# Patient Record
Sex: Male | Born: 1955 | Race: Black or African American | Hispanic: No | Marital: Married
Health system: Southern US, Community
[De-identification: ages and names within clinical notes are randomized; demographics above are authoritative.]

## PROBLEM LIST (undated history)

## (undated) DIAGNOSIS — I2510 Atherosclerotic heart disease of native coronary artery without angina pectoris: Secondary | ICD-10-CM

## (undated) DIAGNOSIS — I10 Essential (primary) hypertension: Secondary | ICD-10-CM

## (undated) DIAGNOSIS — R7303 Prediabetes: Secondary | ICD-10-CM

## (undated) DIAGNOSIS — Z91199 Patient's noncompliance with other medical treatment and regimen due to unspecified reason: Secondary | ICD-10-CM

## (undated) DIAGNOSIS — E785 Hyperlipidemia, unspecified: Secondary | ICD-10-CM

## (undated) HISTORY — DX: Atherosclerotic heart disease of native coronary artery without angina pectoris: I25.10

## (undated) HISTORY — DX: Patient's noncompliance with other medical treatment and regimen due to unspecified reason: Z91.199

---

## 2018-01-03 ENCOUNTER — Encounter (HOSPITAL_COMMUNITY): Payer: Self-pay | Admitting: Emergency Medicine

## 2018-01-03 ENCOUNTER — Emergency Department (HOSPITAL_COMMUNITY): Payer: Non-veteran care

## 2018-01-03 ENCOUNTER — Inpatient Hospital Stay (HOSPITAL_COMMUNITY)
Admission: EM | Admit: 2018-01-03 | Discharge: 2018-01-12 | DRG: 234 | Disposition: A | Discharge status: Home / Self Care | Payer: Non-veteran care | Attending: Cardiothoracic Surgery | Admitting: Cardiothoracic Surgery

## 2018-01-03 ENCOUNTER — Observation Stay (HOSPITAL_COMMUNITY): Payer: Non-veteran care

## 2018-01-03 DIAGNOSIS — I10 Essential (primary) hypertension: Secondary | ICD-10-CM | POA: Diagnosis not present

## 2018-01-03 DIAGNOSIS — Z7982 Long term (current) use of aspirin: Secondary | ICD-10-CM

## 2018-01-03 DIAGNOSIS — R7303 Prediabetes: Secondary | ICD-10-CM | POA: Diagnosis not present

## 2018-01-03 DIAGNOSIS — Z79899 Other long term (current) drug therapy: Secondary | ICD-10-CM

## 2018-01-03 DIAGNOSIS — I214 Non-ST elevation (NSTEMI) myocardial infarction: Secondary | ICD-10-CM | POA: Diagnosis not present

## 2018-01-03 DIAGNOSIS — R791 Abnormal coagulation profile: Secondary | ICD-10-CM | POA: Diagnosis present

## 2018-01-03 DIAGNOSIS — Z951 Presence of aortocoronary bypass graft: Secondary | ICD-10-CM

## 2018-01-03 DIAGNOSIS — I249 Acute ischemic heart disease, unspecified: Secondary | ICD-10-CM | POA: Diagnosis present

## 2018-01-03 DIAGNOSIS — E7800 Pure hypercholesterolemia, unspecified: Secondary | ICD-10-CM

## 2018-01-03 DIAGNOSIS — I2510 Atherosclerotic heart disease of native coronary artery without angina pectoris: Secondary | ICD-10-CM | POA: Diagnosis present

## 2018-01-03 DIAGNOSIS — I200 Unstable angina: Secondary | ICD-10-CM | POA: Diagnosis not present

## 2018-01-03 DIAGNOSIS — I25110 Atherosclerotic heart disease of native coronary artery with unstable angina pectoris: Principal | ICD-10-CM | POA: Diagnosis present

## 2018-01-03 DIAGNOSIS — Z8249 Family history of ischemic heart disease and other diseases of the circulatory system: Secondary | ICD-10-CM

## 2018-01-03 DIAGNOSIS — E785 Hyperlipidemia, unspecified: Secondary | ICD-10-CM | POA: Diagnosis present

## 2018-01-03 HISTORY — DX: Prediabetes: R73.03

## 2018-01-03 HISTORY — DX: Hyperlipidemia, unspecified: E78.5

## 2018-01-03 HISTORY — DX: Essential (primary) hypertension: I10

## 2018-01-03 LAB — HEMOGLOBIN A1C
Hgb A1c MFr Bld: 6.1 % — ABNORMAL HIGH (ref 4.8–5.6)
MEAN PLASMA GLUCOSE: 128.37 mg/dL

## 2018-01-03 LAB — CBC
HEMATOCRIT: 41.5 % (ref 39.0–52.0)
HEMOGLOBIN: 14.6 g/dL (ref 13.0–17.0)
MCH: 33.1 pg (ref 26.0–34.0)
MCHC: 35.2 g/dL (ref 30.0–36.0)
MCV: 94.1 fL (ref 78.0–100.0)
Platelets: 258 10*3/uL (ref 150–400)
RBC: 4.41 MIL/uL (ref 4.22–5.81)
RDW: 13 % (ref 11.5–15.5)
WBC: 7.2 10*3/uL (ref 4.0–10.5)

## 2018-01-03 LAB — I-STAT TROPONIN, ED: TROPONIN I, POC: 0.06 ng/mL (ref 0.00–0.08)

## 2018-01-03 LAB — BASIC METABOLIC PANEL
ANION GAP: 8 (ref 5–15)
BUN: 6 mg/dL (ref 6–20)
CALCIUM: 8.7 mg/dL — AB (ref 8.9–10.3)
CHLORIDE: 104 mmol/L (ref 101–111)
CO2: 26 mmol/L (ref 22–32)
Creatinine, Ser: 0.98 mg/dL (ref 0.61–1.24)
GFR calc non Af Amer: >60 mL/min (ref >60)
Glucose, Bld: 114 mg/dL — ABNORMAL HIGH (ref 65–99)
POTASSIUM: 3.5 mmol/L (ref 3.5–5.1)
Sodium: 138 mmol/L (ref 135–145)

## 2018-01-03 LAB — HEPATIC FUNCTION PANEL
ALBUMIN: 3.7 g/dL (ref 3.5–5.0)
ALT: 17 U/L (ref 17–63)
AST: 19 U/L (ref 15–41)
Alkaline Phosphatase: 72 U/L (ref 38–126)
BILIRUBIN INDIRECT: 0.7 mg/dL (ref 0.3–0.9)
Bilirubin, Direct: 0.1 mg/dL (ref 0.1–0.5)
TOTAL PROTEIN: 6.8 g/dL (ref 6.5–8.1)
Total Bilirubin: 0.8 mg/dL (ref 0.3–1.2)

## 2018-01-03 LAB — RAPID URINE DRUG SCREEN, HOSP PERFORMED
Amphetamines: NOT DETECTED
BENZODIAZEPINES: NOT DETECTED
Barbiturates: NOT DETECTED
COCAINE: NOT DETECTED
Opiates: NOT DETECTED
Tetrahydrocannabinol: POSITIVE — AB

## 2018-01-03 LAB — MAGNESIUM: MAGNESIUM: 1.8 mg/dL (ref 1.7–2.4)

## 2018-01-03 LAB — BRAIN NATRIURETIC PEPTIDE: B NATRIURETIC PEPTIDE 5: 102.8 pg/mL — AB (ref 0.0–100.0)

## 2018-01-03 LAB — D-DIMER, QUANTITATIVE: D-Dimer, Quant: 3.5 ug/mL-FEU — ABNORMAL HIGH (ref 0.00–0.50)

## 2018-01-03 LAB — TSH: TSH: 1.405 u[IU]/mL (ref 0.350–4.500)

## 2018-01-03 LAB — TROPONIN I: Troponin I: 0.05 ng/mL (ref <0.03)

## 2018-01-03 MED ORDER — HEPARIN (PORCINE) IN NACL 100-0.45 UNIT/ML-% IJ SOLN
1350.0000 [IU]/h | INTRAMUSCULAR | Status: DC
  Administered 2018-01-03: 1000 [IU]/h via INTRAVENOUS
  Administered 2018-01-04 – 2018-01-05 (×2): 1350 [IU]/h via INTRAVENOUS
  Filled 2018-01-03 (×3): qty 250

## 2018-01-03 MED ORDER — SODIUM CHLORIDE 0.9% FLUSH
3.0000 mL | Freq: Two times a day (BID) | INTRAVENOUS | Status: DC
  Administered 2018-01-03 – 2018-01-04 (×3): 3 mL via INTRAVENOUS

## 2018-01-03 MED ORDER — POTASSIUM CHLORIDE CRYS ER 20 MEQ PO TBCR
40.0000 meq | EXTENDED_RELEASE_TABLET | Freq: Once | ORAL | Status: AC
  Administered 2018-01-03: 40 meq via ORAL
  Filled 2018-01-03: qty 2

## 2018-01-03 MED ORDER — ACETAMINOPHEN 325 MG PO TABS: 650.0000 mg | ORAL_TABLET | ORAL | Status: DC | PRN

## 2018-01-03 MED ORDER — SODIUM CHLORIDE 0.9 % IV SOLN: 250.0000 mL | INTRAVENOUS | Status: DC | PRN

## 2018-01-03 MED ORDER — IOPAMIDOL (ISOVUE-370) INJECTION 76%
INTRAVENOUS | Status: AC
  Filled 2018-01-03: qty 100

## 2018-01-03 MED ORDER — ONDANSETRON HCL 4 MG/2ML IJ SOLN: 4.0000 mg | Freq: Four times a day (QID) | INTRAMUSCULAR | Status: DC | PRN

## 2018-01-03 MED ORDER — LISINOPRIL 10 MG PO TABS
10.0000 mg | ORAL_TABLET | Freq: Every day | ORAL | Status: DC
  Administered 2018-01-03 – 2018-01-05 (×3): 10 mg via ORAL
  Filled 2018-01-03 (×3): qty 1

## 2018-01-03 MED ORDER — NITROGLYCERIN IN D5W 200-5 MCG/ML-% IV SOLN: 0.0000 ug/min | INTRAVENOUS | Status: DC

## 2018-01-03 MED ORDER — ATORVASTATIN CALCIUM 40 MG PO TABS: 40.0000 mg | ORAL_TABLET | Freq: Every day | ORAL | Status: DC

## 2018-01-03 MED ORDER — ASPIRIN EC 81 MG PO TBEC
81.0000 mg | DELAYED_RELEASE_TABLET | Freq: Every day | ORAL | Status: DC
  Administered 2018-01-04 – 2018-01-05 (×2): 81 mg via ORAL
  Filled 2018-01-03 (×2): qty 1

## 2018-01-03 MED ORDER — NITROGLYCERIN 0.4 MG SL SUBL: 0.4000 mg | SUBLINGUAL_TABLET | SUBLINGUAL | Status: DC | PRN

## 2018-01-03 MED ORDER — SODIUM CHLORIDE 0.9% FLUSH: 3.0000 mL | INTRAVENOUS | Status: DC | PRN

## 2018-01-03 MED ORDER — IOPAMIDOL (ISOVUE-370) INJECTION 76%
100.0000 mL | Freq: Once | INTRAVENOUS | Status: AC | PRN
  Administered 2018-01-03: 100 mL via INTRAVENOUS

## 2018-01-03 MED ORDER — HEPARIN BOLUS VIA INFUSION
4000.0000 [IU] | Freq: Once | INTRAVENOUS | Status: AC
  Administered 2018-01-03: 4000 [IU] via INTRAVENOUS
  Filled 2018-01-03: qty 4000

## 2018-01-03 NOTE — ED Notes (Signed)
MD paged for positive D dimer of 3.5

## 2018-01-03 NOTE — ED Notes (Signed)
Patient endorses central, non-radiating CP x 1 week, intermittent in nature, and has been brought on while at rest (sts it woke him up at 2am this morning) and with exertion (walking) - resting for a few minutes relieves pain. Patient states his father died of cardiac disease at age 60. Patient denies SOB, dizziness/lightheadedness, or N/V.

## 2018-01-03 NOTE — ED Notes (Signed)
EDP states okay for patient to eat/drink - patient provided with [place] sandwich.

## 2018-01-03 NOTE — ED Notes (Signed)
Pt given a ice pack for lt arm

## 2018-01-03 NOTE — ED Triage Notes (Signed)
Per EMS- pt has had intermittent central chest pain for 2 weeks with walking. Pt self administered 648 Asprin/ EMS gage 1 nitro. Per pt he feels better.

## 2018-01-03 NOTE — ED Notes (Signed)
Patient states he doesn't know if he wants to stay, RN explained recommendation from cardiology and patient states he can rest at home - MD made aware and to talk with patient.

## 2018-01-03 NOTE — H&P (Addendum)
Admit date: 01/03/2018 Referring Physician  Dr. Ethelda Chick Primary Physician  [facility] Primary Cardiologist  none Reason for Consultation  Chest pain  HPI: Cameron Camacho is a 62 y.o. male who is being seen today for the evaluation of chest pain at the request of Dr. Doug Sou in ER.  This is a pleasant 62 year old male with a past medical history of hypertension but no cardiac disease who presents the emergency room with complaints of 1 week of intermittent anterior chest pain with no radiation.  Most of them of her occurred with exertion but today had an episode at rest associated with this of breath and diaphoresis but no nausea.  He took 2 aspirin and called EMS and was given 1 sublingual nitroglycerin with complete relief of his symptoms.  He currently denies any chest pain.  Initial troponin 0 0.06.  EKG shows normal sinus rhythm with T wave inversions in 1 aVL V1 and V2 and prolonged QT at 510 ms.  Chest x-ray showed no acute disease.   PMH:   Past Medical History:  Diagnosis Date  . Hyperlipidemia   . Hypertension   . Prediabetes      PSH:  History reviewed. No pertinent surgical history.  Allergies:  Patient has no known allergies. Prior to Admit Meds:   Medications Prior to Admission  Medication Sig Dispense Refill Last Dose  . APPLE CIDER VINEGAR PO Take 15 mLs by mouth daily. Mix in 8 oz water and drink   01/03/2018 at am  . aspirin 325 MG tablet Take 650 mg by mouth once.   01/03/2018 at 1200  . Garlic 1000 MG CAPS Take 1,000 mg by mouth daily.   01/02/2018 at am  . Naphazoline HCl (CLEAR EYES OP) Place 1 drop into both eyes daily as needed (tired eyes).   01/02/2018 at Unknown time  . naproxen sodium (ALEVE) 220 MG tablet Take 220 mg by mouth 2 (two) times daily as needed (pain).   few days ago  . oxymetazoline (AFRIN) 0.05 % nasal spray Place 1 spray into both nostrils 2 (two) times daily.   01/03/2018 at am   Fam HX:    Family History  Problem Relation Age  of Onset  . Hypertension Mother   . CAD Father        Heart attack mid 75s  . Hypertension Brother    Social HX:    Social History   Socioeconomic History  . Marital status: Married    Spouse name: Not on file  . Number of children: Not on file  . Years of education: Not on file  . Highest education level: Not on file  Occupational History  . Not on file  Social Needs  . Financial resource strain: Not on file  . Food insecurity:    Worry: Not on file    Inability: Not on file  . Transportation needs:    Medical: Not on file    Non-medical: Not on file  Tobacco Use  . Smoking status: Passive Smoke Exposure - Never Smoker  Substance and Sexual Activity  . Alcohol use: Not Currently    Frequency: Never  . Drug use: Yes    Types: Marijuana    Comment: Occasional THC for pain  . Sexual activity: Not on file  Lifestyle  . Physical activity:    Days per week: Not on file    Minutes per session: Not on file  . Stress: Not on file  Relationships  .  Social connections:    Talks on phone: Not on file    Gets together: Not on file    Attends religious service: Not on file    Active member of club or organization: Not on file    Attends meetings of clubs or organizations: Not on file    Relationship status: Not on file  . Intimate partner violence:    Fear of current or ex partner: Not on file    Emotionally abused: Not on file    Physically abused: Not on file    Forced sexual activity: Not on file  Other Topics Concern  . Not on file  Social History Narrative  . Not on file     ROS:  All  ROS were addressed and are negative except what is stated in the HPI  Physical Exam: Blood pressure (!) 157/64, pulse 60, temperature 98.4 F (36.9 C), temperature source Oral, resp. rate 20, height  (1.803 m), weight 200 lb 4.8 oz (90.9 kg), SpO2 100 %.    General: Well developed, well nourished, in no acute distress Head: Eyes PERRLA, No xanthomas.   Normal cephalic and  atramatic  Lungs:   Clear bilaterally to auscultation and percussion. Heart:   HRRR S1 S2 Pulses are 2+ & equal.            No carotid bruit. No JVD.  No abdominal bruits. No femoral bruits. Abdomen: Bowel sounds are positive, abdomen soft and non-tender without masses or                  Hernia's noted. Msk:  Back normal, normal gait. Normal strength and tone for age. Extremities:   No clubbing, cyanosis or edema.  DP +1 Neuro: Alert and oriented X 3. Psych:  Good affect, responds appropriately    Labs:   Lab Results  Component Value Date   WBC 7.2 01/04/2018   HGB 14.2 01/04/2018   HCT 41.4 01/04/2018   MCV 93.7 01/04/2018   PLT 236 01/04/2018    Recent Labs  Lab 01/03/18 1415 01/04/18 0513  NA 138 138  K 3.5 3.9  CL 104 102  CO2 26 27  BUN 6 7  CREATININE 0.98 1.07  CALCIUM 8.7* 8.6*  PROT 6.8  --   BILITOT 0.8  --   ALKPHOS 72  --   ALT 17  --   AST 19  --   GLUCOSE 114* 126*   No results found for: PTT No results found for: INR, PROTIME Lab Results  Component Value Date   TROPONINI 0.05 (HH) 01/04/2018     Lab Results  Component Value Date   CHOL 169 01/04/2018   Lab Results  Component Value Date   HDL 56 01/04/2018   Lab Results  Component Value Date   LDLCALC 103 (H) 01/04/2018   Lab Results  Component Value Date   TRIG 51 01/04/2018   Lab Results  Component Value Date   CHOLHDL 3.0 01/04/2018   No results found for: LDLDIRECT    Radiology:  Dg Chest 2 View  Result Date: 01/03/2018 CLINICAL DATA:  Chest pain for 1 week. Shortness of breath and wheezing. EXAM: CHEST - 2 VIEW COMPARISON:  None. FINDINGS: The heart size and mediastinal contours are within normal limits. Both lungs are clear. Mild eventration of left hemidiaphragm noted. The visualized skeletal structures are unremarkable. IMPRESSION: No active cardiopulmonary disease. Electronically Signed   By: Myles Rosenthal M.D.   On: 01/03/2018 15:10  Ct Angio Chest Pe W Or Wo  Contrast  Result Date: 01/04/2018 CLINICAL DATA:  Anterior chest pain for a week. Shortness of breath. Elevated D-dimer. EXAM: CT ANGIOGRAPHY CHEST WITH CONTRAST TECHNIQUE: Multidetector CT imaging of the chest was performed using the standard protocol during bolus administration of intravenous contrast. Multiplanar CT image reconstructions and MIPs were obtained to evaluate the vascular anatomy. CONTRAST:  ISOVUE-370 IOPAMIDOL (ISOVUE-370) INJECTION 76% COMPARISON:  None. FINDINGS: Cardiovascular: Satisfactory opacification of the pulmonary arteries to the segmental level. No evidence of pulmonary embolism. Normal heart size. No pericardial effusion. Coronary artery calcifications. Aortic calcifications. Mediastinum/Nodes: Calcified lymph nodes in the mediastinum and right hilum consistent with postinflammatory change. Esophagus is decompressed. Thyroid gland is unremarkable. Lungs/Pleura: Motion artifact limits evaluation. No definite airspace disease or consolidation. Mild dependent changes. Calcified granuloma in the right lung. No pleural effusions. No pneumothorax. Airways are patent. Upper Abdomen: Stone in the upper pole right kidney measuring 8 mm in diameter. No evidence of hydronephrosis. Musculoskeletal: Degenerative changes in the spine. No destructive bone lesions. Review of the MIP images confirms the above findings. IMPRESSION: 1. No evidence of significant pulmonary embolus. 2. Calcified granuloma in the right lung with calcified right hilar and mediastinal lymph nodes consistent with postinflammatory change. 3. Apparent nonobstructing stone in the right kidney. 4. Aortic atherosclerosis.  Coronary artery calcification. Aortic Atherosclerosis (ICD10-I70.0). Electronically Signed   By: Burman Nieves M.D.   On: 01/04/2018 01:06     Telemetry    NSR - Personally Reviewed  ECG    NSR at 61bpm with T wave inversions in I, aVL, V1 and V2 and prolonged QTc - Personally  Reviewed   ASSESSMENT/PLAN:   1.  Unstable angina pectoris -Chest pain consistent with angina -Initial point-of-care troponin negative -EKG with changes consistent with ischemia in the lateral leads -He is currently pain-free -continue to cycle troponin -if trop remains negative then plan stress test in am -if trop positive plan cath -check 2D echo in am -Start IV heparin gtt -start IV NTG gtt -ASA  daily -cannot start BB due to borderline bradycardia -add Lipitor  daily -check FLP and HbA1C in am  2.  HTN  -not on any meds at home -start Lisinopril  daily and titrate as needed -no BB due to borderline bradycardia  Armanda Magic, MD  01/04/2018  10:39 AM

## 2018-01-03 NOTE — ED Provider Notes (Addendum)
[facility] EMERGENCY DEPARTMENT Provider Note   CSN: [id number] Arrival date & time: 01/03/18  1403     History   Chief Complaint Chief Complaint  Patient presents with  . Chest Pain    HPI Cameron Camacho is a 62 y.o. male presenting for evaluation of chest pain.   Pt states he has been having chest pain for the past week. Pain is present with exertion, resolves with rest. Pain is described as a tightness, with associated SOB and sweating. It can be brought on by walking < 1/2 block. He denies nausea or vomiting. sxs last for about 5 minute before resolving. today he has episode of pain without exertion. Took 2 adult ASA. EMS was called today, and sxs resolved with nitro. Currently asymptomatic. He denies fevers, chills, trauma, cough, abd pain, urinary sxs, or abnormal bowel movements,. He denies recent travel, surgeries, immobilization, trauma, hormone use, h/o dvt/pe, or h/o cancer. He has been told he has issues with his BP, but does not take medication. No other medical problems. Denies h/o ACS, denies tobacco use. Dad died of MI at 34 no other family h/o cardiac problems. No h/o DM or HLD. At baseline, pt can play 18 rounds gold without sxs.    HPI  Past Medical History:  Diagnosis Date  . Hypertension     There are no active problems to display for this patient.   History reviewed. No pertinent surgical history.      Home Medications    Prior to Admission medications   Not on File    Family History No family history on file.  Social History Social History   Tobacco Use  . Smoking status: Not on file  Substance Use Topics  . Alcohol use: Not on file  . Drug use: Not on file     Allergies   Patient has no known allergies.   Review of Systems Review of Systems  Respiratory: Positive for shortness of breath.   Cardiovascular: Positive for chest pain.  All other systems reviewed and are negative.    Physical Exam Updated  Vital Signs BP (!) 174/83   Pulse 62   Temp 99.3 F (37.4 C) (Oral)   Resp 18   Ht  (1.803 m)   Wt 83.9 kg (185 lb)   SpO2 99%   BMI 25.80 kg/m   Physical Exam  Constitutional: He is oriented to person, place, and time. He appears well-developed and well-nourished. No distress.  Sitting in bed in NAD  HENT:  Head: Normocephalic and atraumatic.  Eyes: Pupils are equal, round, and reactive to light. Conjunctivae and EOM are normal.  Neck: Normal range of motion. Neck supple.  Cardiovascular: Normal rate, regular rhythm and intact distal pulses.  Pulmonary/Chest: Effort normal and breath sounds normal. No respiratory distress. He has no wheezes. He exhibits no tenderness.  Speaking in full sentences. Clear lung sounds  Abdominal: Soft. He exhibits no distension. There is no tenderness. There is no guarding.  Musculoskeletal: Normal range of motion.  No leg pain or swelling  Neurological: He is alert and oriented to person, place, and time.  Skin: Skin is warm and dry.  Psychiatric: He has a normal mood and affect.  Nursing note and vitals reviewed.    ED Treatments / Results  Labs (all labs ordered are listed, but only abnormal results are displayed) Labs Reviewed  BASIC METABOLIC PANEL - Abnormal; Notable for the following components:  Result Value   Glucose, Bld 114 (*)    Calcium 8.7 (*)    All other components within normal limits  CBC  D-DIMER, QUANTITATIVE (NOT AT [facility])  BRAIN NATRIURETIC PEPTIDE  I-STAT TROPONIN, ED    EKG EKG Interpretation  Date/Time:  Saturday January 03 2018 14:14:04 EDT Ventricular Rate:  61 PR Interval:  122 QRS Duration: 82 QT Interval:  508 QTC Calculation: 511 R Axis:   71 Text Interpretation:  Normal sinus rhythm Nonspecific T wave abnormality Prolonged QT Abnormal ECG No old tracing to compare Confirmed by [place], Doreatha Martin [phone]) on 01/03/2018 4:28:29 PM   Radiology Dg Chest 2 View  Result Date: 01/03/2018 CLINICAL  DATA:  Chest pain for 1 week. Shortness of breath and wheezing. EXAM: CHEST - 2 VIEW COMPARISON:  None. FINDINGS: The heart size and mediastinal contours are within normal limits. Both lungs are clear. Mild eventration of left hemidiaphragm noted. The visualized skeletal structures are unremarkable. IMPRESSION: No active cardiopulmonary disease. Electronically Signed   By: Myles Rosenthal M.D.   On: 01/03/2018 15:10    Procedures Procedures (including critical care time)  Medications Ordered in ED Medications - No data to display   Initial Impression / Assessment and Plan / ED Course  I have reviewed the triage vital signs and the nursing notes.  Pertinent labs & imaging results that were available during my care of the patient were reviewed by me and considered in my medical decision making (see chart for details).     Resented for evaluation of chest pain.  History concerning for angina, patient reports associated shortness of breath and diaphoresis.  Symptoms present with exertion, today symptoms began without exertion.  Improved with nitro.  History of hypertension and dad died of heart attack at 73.  No other cardiac risk factors. CXR and labs reassuring.  Initial troponin negative, EKG reassuring.  However due to patient's history, will consult with cardiology. Will order BNP and ddimer to r/u less likely causes of pain including PE and chf.  Case discussed with attending, Dr. Ethelda Chick evaluate the patient.  Discussed with cardiology, they will evaluate the pt.   Pt to be admitted to cardiology service.   Final Clinical Impressions(s) / ED Diagnoses   Final diagnoses:  Unstable angina [facility])    ED Discharge Orders    None       Alveria Apley, PA-C 01/03/18 1717    Doug Sou, MD 01/04/18 0030    Lisbeth Renshaw, Murice Barbar, PA-C 01/13/18 1151    Doug Sou, MD 01/17/18 1438

## 2018-01-03 NOTE — ED Notes (Addendum)
Patient ambulated in hall about 600 ft. Patient tolerated well, but did note an 8/10 pain occur in his chest while walking.

## 2018-01-03 NOTE — ED Notes (Signed)
MD at bedside. 

## 2018-01-03 NOTE — ED Provider Notes (Signed)
Complains of anterior nonradiating chest pain onset a week ago pain occurs when he walks or exerts himself and resolves with rest.  Today he had an episode while at rest.  Symptoms accompanied by shortness of breath and sweatiness no nausea.  He treated himself with 2 adult aspirin tablets this morning.  EMS treated patient with 1 sublingual nitroglycerin with complete relief.  He is presently asymptomatic on exam no distress lungs clear to auscultation heart regular rate and rhythm no murmurs abdomen nondistended nontender extremities without edema   Doug Sou, MD 01/03/18 1640

## 2018-01-03 NOTE — Progress Notes (Signed)
ANTICOAGULATION CONSULT NOTE - Initial Consult  Pharmacy Consult for heparin Indication: chest pain/ACS  No Known Allergies  Patient Measurements: Height:  (180.3 cm) Weight: 185 lb (83.9 kg) IBW/kg (Calculated) : 75.3 Heparin Dosing Weight: 83.9 kg  Vital Signs: Temp: 99.3 F (37.4 C) (04/27 1410) Temp Source: Oral (04/27 1410) BP: 165/81 (04/27 1630) Pulse Rate: 65 (04/27 1630)  Labs: Recent Labs    01/03/18 1415  HGB 14.6  HCT 41.5  PLT 258  CREATININE 0.98    Estimated Creatinine Clearance: 86.4 mL/min (by C-G formula based on SCr of 0.98 mg/dL).   Medical History: Past Medical History:  Diagnosis Date  . Hypertension     Medications:  Scheduled:  . lisinopril  10 mg Oral Daily  . potassium chloride  40 mEq Oral Once    Assessment: 61 yom presenting with chest pain for 1 week. Received aspirin and sublingual nitro with EMS with complete relief of his symptoms. EKG showed NSR with T wave inversions. No anticoagulation prior to admission.  Hgb 14.6, plts 258. No signs/symptoms of bleeding.   Goal of Therapy:  Heparin level 0.3-0.7 units/ml Monitor platelets by anticoagulation protocol: Yes   Plan:  Give 4000 units bolus x 1 Start heparin infusion at 1000 units/hr Check anti-Xa level in 6 hours and daily while on heparin Continue to monitor H&H and platelets  Girard Cooter, PharmD Clinical Pharmacist  Pager: [phone] Clinical Phone for 01/03/2018 until 3:30pm: [phone] If after 3:30pm, please call main pharmacy at [phone] 01/03/2018,5:17 PM

## 2018-01-04 ENCOUNTER — Other Ambulatory Visit: Payer: Self-pay

## 2018-01-04 ENCOUNTER — Encounter (HOSPITAL_COMMUNITY): Payer: Self-pay | Admitting: *Deleted

## 2018-01-04 ENCOUNTER — Other Ambulatory Visit (HOSPITAL_COMMUNITY): Payer: No Typology Code available for payment source

## 2018-01-04 DIAGNOSIS — I200 Unstable angina: Secondary | ICD-10-CM | POA: Diagnosis not present

## 2018-01-04 DIAGNOSIS — E785 Hyperlipidemia, unspecified: Secondary | ICD-10-CM | POA: Diagnosis present

## 2018-01-04 DIAGNOSIS — I214 Non-ST elevation (NSTEMI) myocardial infarction: Secondary | ICD-10-CM | POA: Diagnosis not present

## 2018-01-04 DIAGNOSIS — I249 Acute ischemic heart disease, unspecified: Secondary | ICD-10-CM | POA: Diagnosis present

## 2018-01-04 DIAGNOSIS — I10 Essential (primary) hypertension: Secondary | ICD-10-CM | POA: Diagnosis present

## 2018-01-04 DIAGNOSIS — R7303 Prediabetes: Secondary | ICD-10-CM | POA: Diagnosis not present

## 2018-01-04 DIAGNOSIS — I2510 Atherosclerotic heart disease of native coronary artery without angina pectoris: Secondary | ICD-10-CM | POA: Diagnosis not present

## 2018-01-04 LAB — HEPARIN LEVEL (UNFRACTIONATED)
HEPARIN UNFRACTIONATED: 0.28 [IU]/mL — AB (ref 0.30–0.70)
HEPARIN UNFRACTIONATED: 0.37 [IU]/mL (ref 0.30–0.70)
Heparin Unfractionated: 0.21 IU/mL — ABNORMAL LOW (ref 0.30–0.70)

## 2018-01-04 LAB — LIPID PANEL
CHOLESTEROL: 169 mg/dL (ref 0–200)
HDL: 56 mg/dL (ref >40)
LDL Cholesterol: 103 mg/dL — ABNORMAL HIGH (ref 0–99)
Total CHOL/HDL Ratio: 3 RATIO
Triglycerides: 51 mg/dL (ref <150)
VLDL: 10 mg/dL (ref 0–40)

## 2018-01-04 LAB — HIV ANTIBODY (ROUTINE TESTING W REFLEX): HIV Screen 4th Generation wRfx: NONREACTIVE

## 2018-01-04 LAB — CBC
HCT: 41.4 % (ref 39.0–52.0)
Hemoglobin: 14.2 g/dL (ref 13.0–17.0)
MCH: 32.1 pg (ref 26.0–34.0)
MCHC: 34.3 g/dL (ref 30.0–36.0)
MCV: 93.7 fL (ref 78.0–100.0)
PLATELETS: 236 10*3/uL (ref 150–400)
RBC: 4.42 MIL/uL (ref 4.22–5.81)
RDW: 12.9 % (ref 11.5–15.5)
WBC: 7.2 10*3/uL (ref 4.0–10.5)

## 2018-01-04 LAB — BASIC METABOLIC PANEL
ANION GAP: 9 (ref 5–15)
BUN: 7 mg/dL (ref 6–20)
CALCIUM: 8.6 mg/dL — AB (ref 8.9–10.3)
CHLORIDE: 102 mmol/L (ref 101–111)
CO2: 27 mmol/L (ref 22–32)
CREATININE: 1.07 mg/dL (ref 0.61–1.24)
GFR calc non Af Amer: >60 mL/min (ref >60)
Glucose, Bld: 126 mg/dL — ABNORMAL HIGH (ref 65–99)
Potassium: 3.9 mmol/L (ref 3.5–5.1)
SODIUM: 138 mmol/L (ref 135–145)

## 2018-01-04 LAB — TROPONIN I
Troponin I: 0.07 ng/mL (ref <0.03)
Troponin I: 0.05 ng/mL (ref <0.03)

## 2018-01-04 MED ORDER — SODIUM CHLORIDE 0.9% FLUSH
3.0000 mL | Freq: Two times a day (BID) | INTRAVENOUS | Status: DC
  Administered 2018-01-05: 3 mL via INTRAVENOUS

## 2018-01-04 MED ORDER — SODIUM CHLORIDE 0.9% FLUSH: 3.0000 mL | INTRAVENOUS | Status: DC | PRN

## 2018-01-04 MED ORDER — SODIUM CHLORIDE 0.9 % IV SOLN: 250.0000 mL | INTRAVENOUS | Status: DC | PRN

## 2018-01-04 MED ORDER — SODIUM CHLORIDE 0.9 % WEIGHT BASED INFUSION
3.0000 mL/kg/h | INTRAVENOUS | Status: DC
  Administered 2018-01-05: 3 mL/kg/h via INTRAVENOUS

## 2018-01-04 MED ORDER — SODIUM CHLORIDE 0.9 % WEIGHT BASED INFUSION
1.0000 mL/kg/h | INTRAVENOUS | Status: DC
  Administered 2018-01-05: 1 mL/kg/h via INTRAVENOUS

## 2018-01-04 MED ORDER — ASPIRIN 81 MG PO CHEW
81.0000 mg | CHEWABLE_TABLET | ORAL | Status: AC
  Administered 2018-01-05: 81 mg via ORAL
  Filled 2018-01-04 (×2): qty 1

## 2018-01-04 MED ORDER — POTASSIUM CHLORIDE CRYS ER 20 MEQ PO TBCR
20.0000 meq | EXTENDED_RELEASE_TABLET | Freq: Once | ORAL | Status: AC
  Administered 2018-01-04: 20 meq via ORAL
  Filled 2018-01-04: qty 1

## 2018-01-04 MED ORDER — MAGNESIUM SULFATE IN D5W 1-5 GM/100ML-% IV SOLN
1.0000 g | Freq: Once | INTRAVENOUS | Status: AC
  Administered 2018-01-04: 1 g via INTRAVENOUS
  Filled 2018-01-04: qty 100

## 2018-01-04 MED ORDER — ATORVASTATIN CALCIUM 80 MG PO TABS
80.0000 mg | ORAL_TABLET | Freq: Every day | ORAL | Status: DC
  Administered 2018-01-04 – 2018-01-11 (×7): 80 mg via ORAL
  Filled 2018-01-04 (×7): qty 1

## 2018-01-04 NOTE — H&P (View-Only) (Signed)
Progress Note  Patient Name: DEMARQUS JOCSON Date of Encounter: 01/04/2018  Primary Cardiologist: New to Dr. Mayford Knife this admission  Subjective   Feeling fine overnight at rest. No recurrent CP. No dyspnea, leg swelling.   Inpatient Medications    Scheduled Meds: . [START ON 01/05/2018] aspirin  81 mg Oral Pre-Cath  . aspirin EC  81 mg Oral Daily  . atorvastatin  40 mg Oral q1800  . iopamidol      . lisinopril  10 mg Oral Daily  . sodium chloride flush  3 mL Intravenous Q12H  . sodium chloride flush  3 mL Intravenous Q12H   Continuous Infusions: . sodium chloride    . sodium chloride    . [START ON 01/05/2018] sodium chloride     Followed by  . [START ON 01/05/2018] sodium chloride    . heparin 1,200 Units/hr (01/04/18 0254)  . nitroGLYCERIN     PRN Meds: sodium chloride, sodium chloride, acetaminophen, nitroGLYCERIN, ondansetron (ZOFRAN) IV, sodium chloride flush, sodium chloride flush   Vital Signs    Vitals:   01/03/18 2100 01/03/18 2104 01/04/18 0025 01/04/18 0652  BP: (!) 184/93  (!) 154/70 (!) 147/73  Pulse: (!) 59  (!) 58 60  Resp: 17     Temp: 98.3 F (36.8 C)  98.4 F (36.9 C) 98.4 F (36.9 C)  TempSrc: Oral  Oral Oral  SpO2: 99% 99% 99% 100%  Weight:  200 lb 4.8 oz (90.9 kg)    Height:        Intake/Output Summary (Last 24 hours) at 01/04/2018 0810 Last data filed at 01/04/2018 0754 Gross per 24 hour  Intake 374.5 ml  Output 800 ml  Net -425.5 ml   Filed Weights   01/03/18 1410 01/03/18 2104  Weight: 185 lb (83.9 kg) 200 lb 4.8 oz (90.9 kg)    Telemetry    NSR - Personally Reviewed  ECG    NSR biphasic TW in I, II, mild TWI in avL, more pronounced TWI in precordial leads, QTc  - Personally Reviewed  Physical Exam   GEN: No acute distress.  HEENT: Normocephalic, atraumatic, sclera non-icteric. Neck: No JVD or bruits. Cardiac: RRR no murmurs, rubs, or gallops.  Radials/DP/PT 1+ and equal bilaterally.  Respiratory: Clear to  auscultation bilaterally. Breathing is unlabored. GI: Soft, nontender, non-distended, BS +x 4. MS: no deformity. Extremities: No clubbing or cyanosis. No edema. Distal pedal pulses are 2+ and equal bilaterally. Neuro:  AAOx3. Follows commands. Psych:  Responds to questions appropriately with a normal affect.  Labs    Chemistry Recent Labs  Lab 01/03/18 1415 01/04/18 0513  NA 138 138  K 3.5 3.9  CL 104 102  CO2 26 27  GLUCOSE 114* 126*  BUN 6 7  CREATININE 0.98 1.07  CALCIUM 8.7* 8.6*  PROT 6.8  --   ALBUMIN 3.7  --   AST 19  --   ALT 17  --   ALKPHOS 72  --   BILITOT 0.8  --   GFRNONAA >60 >60  GFRAA >60 >60  ANIONGAP 8 9     Hematology Recent Labs  Lab 01/03/18 1415 01/04/18 0513  WBC 7.2 7.2  RBC 4.41 4.42  HGB 14.6 14.2  HCT 41.5 41.4  MCV 94.1 93.7  MCH 33.1 32.1  MCHC 35.2 34.3  RDW 13.0 12.9  PLT 258 236    Cardiac Enzymes Recent Labs  Lab 01/03/18 1935 01/04/18 0035 01/04/18 0513  TROPONINI 0.05* 0.07*  0.05*    Recent Labs  Lab 01/03/18 1429  TROPIPOC 0.06     BNP Recent Labs  Lab 01/03/18 1609  BNP 102.8*     DDimer  Recent Labs  Lab 01/03/18 1609  DDIMER 3.50*     Radiology    Dg Chest 2 View  Result Date: 01/03/2018 CLINICAL DATA:  Chest pain for 1 week. Shortness of breath and wheezing. EXAM: CHEST - 2 VIEW COMPARISON:  None. FINDINGS: The heart size and mediastinal contours are within normal limits. Both lungs are clear. Mild eventration of left hemidiaphragm noted. The visualized skeletal structures are unremarkable. IMPRESSION: No active cardiopulmonary disease. Electronically Signed   By: Myles Rosenthal M.D.   On: 01/03/2018 15:10   Ct Angio Chest Pe W Or Wo Contrast  Result Date: 01/04/2018 CLINICAL DATA:  Anterior chest pain for a week. Shortness of breath. Elevated D-dimer. EXAM: CT ANGIOGRAPHY CHEST WITH CONTRAST TECHNIQUE: Multidetector CT imaging of the chest was performed using the standard protocol during bolus  administration of intravenous contrast. Multiplanar CT image reconstructions and MIPs were obtained to evaluate the vascular anatomy. CONTRAST:  ISOVUE-370 IOPAMIDOL (ISOVUE-370) INJECTION 76% COMPARISON:  None. FINDINGS: Cardiovascular: Satisfactory opacification of the pulmonary arteries to the segmental level. No evidence of pulmonary embolism. Normal heart size. No pericardial effusion. Coronary artery calcifications. Aortic calcifications. Mediastinum/Nodes: Calcified lymph nodes in the mediastinum and right hilum consistent with postinflammatory change. Esophagus is decompressed. Thyroid gland is unremarkable. Lungs/Pleura: Motion artifact limits evaluation. No definite airspace disease or consolidation. Mild dependent changes. Calcified granuloma in the right lung. No pleural effusions. No pneumothorax. Airways are patent. Upper Abdomen: Stone in the upper pole right kidney measuring 8 mm in diameter. No evidence of hydronephrosis. Musculoskeletal: Degenerative changes in the spine. No destructive bone lesions. Review of the MIP images confirms the above findings. IMPRESSION: 1. No evidence of significant pulmonary embolus. 2. Calcified granuloma in the right lung with calcified right hilar and mediastinal lymph nodes consistent with postinflammatory change. 3. Apparent nonobstructing stone in the right kidney. 4. Aortic atherosclerosis.  Coronary artery calcification. Aortic Atherosclerosis (ICD10-I70.0). Electronically Signed   By: Burman Nieves M.D.   On: 01/04/2018 01:06    Cardiac Studies   Pending  Patient Profile     62 y.o. male with untreated HTN, pre-diabetes, THC use presented to [facility] with chest pain suggestive of unstable angina. Troponin initially negative then 0.05-0.07-0.05. D-dimer 3.5 (ordered by EDP); CTA neg for PE but did show aortic atherosclerosis and coronary calcification. EKG suggestive of ischemia with QT prolongation as well.  Assessment & Plan    1. Chest pain  suggestive of unstable angina/possible small NSTEMI  - troponins are slightly elevated and CTA shows coronary calcification. EKG also indicative of ischemia. D/w Dr. Mayford Knife, plan cath. I put him on the add-on board for tomorrow. Risks and benefits of cardiac catheterization have been discussed with the patient.  These include bleeding, infection, kidney damage, stroke, heart attack, death.  The patient understands these risks and is willing to proceed. He is aware to notify nurse of any recurrent chest pain. He is not started on beta blocker given baseline bradycardia. Will increase newly initiated atorvastatin to  qpm. If the patient is tolerating statin at time of follow-up appointment, would consider rechecking liver function/lipid panel in 6-8 weeks. Dr. Mayford Knife had intended for the patient to start on IV NTG but nurse note at 1737 yesterday states "discontinued by cardiology" (not clear why). Pt remains CP free  so will await rounding MD review for input on nitrate therapy -> may need additional BP control.  2. HTN - BP improving. See above.   3. Pre-diabetes - A1C 6.1, will need OP follow-up.  4. QT prolongation - ? Due to ischemia. No old tracings to compare to. K 3.5 on admission -> given KCl -> 3.9. Will give another today to keep K 4.0 or greater. Mg was 1.8 so will give 1g Mag Sulfate and recheck in AM to keep goal Mg 2.0 or greater. Avoid QT prolonging agents. He was not on any meds at home.  5. Elevated d-dimer - CTA neg for PE. No signs of DVT on exam. CT otherwise shows calcified granuloma in the right lung with calcified right hilar and mediastinal lymph nodes consistent with postinflammatory change. Also incidental kidney stone. This can be followed up by primary care. I discussed with patient.  For questions or updates, please contact [facility] Please consult [email or web address] for contact info under Cardiology/STEMI.  Signed, Laurann Montana, PA-C 01/04/2018, 8:10 AM

## 2018-01-04 NOTE — Progress Notes (Signed)
Patient resting comfortably during shift report. Denies complaints.  

## 2018-01-04 NOTE — Progress Notes (Signed)
Patient refused bed alarm. Will continue to monitor patient. 

## 2018-01-04 NOTE — Progress Notes (Signed)
ANTICOAGULATION CONSULT NOTE   Pharmacy Consult for Heparin Indication: chest pain/ACS  No Known Allergies  Patient Measurements: Height:  (180.3 cm) Weight: 200 lb 4.8 oz (90.9 kg) IBW/kg (Calculated) : 75.3 Heparin Dosing Weight: 83.9 kg  Vital Signs: Temp: 98.4 F (36.9 C) (04/28 0025) Temp Source: Oral (04/28 0025) BP: 154/70 (04/28 0025) Pulse Rate: 58 (04/28 0025)  Labs: Recent Labs    01/03/18 1415 01/03/18 1935 01/04/18 0035  HGB 14.6  --   --   HCT 41.5  --   --   PLT 258  --   --   HEPARINUNFRC  --   --  0.21*  CREATININE 0.98  --   --   TROPONINI  --  0.05* 0.07*    Estimated Creatinine Clearance: 93.6 mL/min (by C-G formula based on SCr of 0.98 mg/dL).   Medical History: Past Medical History:  Diagnosis Date  . Hypertension     Medications:  Scheduled:  . aspirin EC  81 mg Oral Daily  . atorvastatin  40 mg Oral q1800  . iopamidol      . lisinopril  10 mg Oral Daily  . sodium chloride flush  3 mL Intravenous Q12H    Assessment: 61 yom presenting with chest pain for 1 week. Received aspirin and sublingual nitro with EMS with complete relief of his symptoms. EKG showed NSR with T wave inversions. No anticoagulation prior to admission.  Hgb 14.6, plts 258. No signs/symptoms of bleeding.   4/28 AM Update: troponin 0.07, D-dimer is 3.5 but CT Angio is negative for PE, heparin level low, no issue per RN.   Goal of Therapy:  Heparin level 0.3-0.7 units/ml Monitor platelets by anticoagulation protocol: Yes   Plan:  Inc heparin to 1200 units/hr 1000 HL  Abran Duke, PharmD, BCPS Clinical Pharmacist Phone: [phone]

## 2018-01-04 NOTE — Progress Notes (Signed)
 Progress Note  Patient Name: Cameron Camacho Date of Encounter: 01/04/2018  Primary Cardiologist: New to Dr. Turner this admission  Subjective   Feeling fine overnight at rest. No recurrent CP. No dyspnea, leg swelling.   Inpatient Medications    Scheduled Meds: . [START ON 01/05/2018] aspirin  81 mg Oral Pre-Cath  . aspirin EC  81 mg Oral Daily  . atorvastatin  40 mg Oral q1800  . iopamidol      . lisinopril  10 mg Oral Daily  . sodium chloride flush  3 mL Intravenous Q12H  . sodium chloride flush  3 mL Intravenous Q12H   Continuous Infusions: . sodium chloride    . sodium chloride    . [START ON 01/05/2018] sodium chloride     Followed by  . [START ON 01/05/2018] sodium chloride    . heparin 1,200 Units/hr (01/04/18 0254)  . nitroGLYCERIN     PRN Meds: sodium chloride, sodium chloride, acetaminophen, nitroGLYCERIN, ondansetron (ZOFRAN) IV, sodium chloride flush, sodium chloride flush   Vital Signs    Vitals:   01/03/18 2100 01/03/18 2104 01/04/18 0025 01/04/18 0652  BP: (!) 184/93  (!) 154/70 (!) 147/73  Pulse: (!) 59  (!) 58 60  Resp: 17     Temp: 98.3 F (36.8 C)  98.4 F (36.9 C) 98.4 F (36.9 C)  TempSrc: Oral  Oral Oral  SpO2: 99% 99% 99% 100%  Weight:  200 lb 4.8 oz (90.9 kg)    Height:        Intake/Output Summary (Last 24 hours) at 01/04/2018 0810 Last data filed at 01/04/2018 0754 Gross per 24 hour  Intake 374.5 ml  Output 800 ml  Net -425.5 ml   Filed Weights   01/03/18 1410 01/03/18 2104  Weight: 185 lb (83.9 kg) 200 lb 4.8 oz (90.9 kg)    Telemetry    NSR - Personally Reviewed  ECG    NSR biphasic TW in I, II, mild TWI in avL, more pronounced TWI in precordial leads, QTc 504ms  - Personally Reviewed  Physical Exam   GEN: No acute distress.  HEENT: Normocephalic, atraumatic, sclera non-icteric. Neck: No JVD or bruits. Cardiac: RRR no murmurs, rubs, or gallops.  Radials/DP/PT 1+ and equal bilaterally.  Respiratory: Clear to  auscultation bilaterally. Breathing is unlabored. GI: Soft, nontender, non-distended, BS +x 4. MS: no deformity. Extremities: No clubbing or cyanosis. No edema. Distal pedal pulses are 2+ and equal bilaterally. Neuro:  AAOx3. Follows commands. Psych:  Responds to questions appropriately with a normal affect.  Labs    Chemistry Recent Labs  Lab 01/03/18 1415 01/04/18 0513  NA 138 138  K 3.5 3.9  CL 104 102  CO2 26 27  GLUCOSE 114* 126*  BUN 6 7  CREATININE 0.98 1.07  CALCIUM 8.7* 8.6*  PROT 6.8  --   ALBUMIN 3.7  --   AST 19  --   ALT 17  --   ALKPHOS 72  --   BILITOT 0.8  --   GFRNONAA >60 >60  GFRAA >60 >60  ANIONGAP 8 9     Hematology Recent Labs  Lab 01/03/18 1415 01/04/18 0513  WBC 7.2 7.2  RBC 4.41 4.42  HGB 14.6 14.2  HCT 41.5 41.4  MCV 94.1 93.7  MCH 33.1 32.1  MCHC 35.2 34.3  RDW 13.0 12.9  PLT 258 236    Cardiac Enzymes Recent Labs  Lab 01/03/18 1935 01/04/18 0035 01/04/18 0513  TROPONINI 0.05* 0.07*   0.05*    Recent Labs  Lab 01/03/18 1429  TROPIPOC 0.06     BNP Recent Labs  Lab 01/03/18 1609  BNP 102.8*     DDimer  Recent Labs  Lab 01/03/18 1609  DDIMER 3.50*     Radiology    Dg Chest 2 View  Result Date: 01/03/2018 CLINICAL DATA:  Chest pain for 1 week. Shortness of breath and wheezing. EXAM: CHEST - 2 VIEW COMPARISON:  None. FINDINGS: The heart size and mediastinal contours are within normal limits. Both lungs are clear. Mild eventration of left hemidiaphragm noted. The visualized skeletal structures are unremarkable. IMPRESSION: No active cardiopulmonary disease. Electronically Signed   By: John  Stahl M.D.   On: 01/03/2018 15:10   Ct Angio Chest Pe W Or Wo Contrast  Result Date: 01/04/2018 CLINICAL DATA:  Anterior chest pain for a week. Shortness of breath. Elevated D-dimer. EXAM: CT ANGIOGRAPHY CHEST WITH CONTRAST TECHNIQUE: Multidetector CT imaging of the chest was performed using the standard protocol during bolus  administration of intravenous contrast. Multiplanar CT image reconstructions and MIPs were obtained to evaluate the vascular anatomy. CONTRAST:  100mL ISOVUE-370 IOPAMIDOL (ISOVUE-370) INJECTION 76% COMPARISON:  None. FINDINGS: Cardiovascular: Satisfactory opacification of the pulmonary arteries to the segmental level. No evidence of pulmonary embolism. Normal heart size. No pericardial effusion. Coronary artery calcifications. Aortic calcifications. Mediastinum/Nodes: Calcified lymph nodes in the mediastinum and right hilum consistent with postinflammatory change. Esophagus is decompressed. Thyroid gland is unremarkable. Lungs/Pleura: Motion artifact limits evaluation. No definite airspace disease or consolidation. Mild dependent changes. Calcified granuloma in the right lung. No pleural effusions. No pneumothorax. Airways are patent. Upper Abdomen: Stone in the upper pole right kidney measuring 8 mm in diameter. No evidence of hydronephrosis. Musculoskeletal: Degenerative changes in the spine. No destructive bone lesions. Review of the MIP images confirms the above findings. IMPRESSION: 1. No evidence of significant pulmonary embolus. 2. Calcified granuloma in the right lung with calcified right hilar and mediastinal lymph nodes consistent with postinflammatory change. 3. Apparent nonobstructing stone in the right kidney. 4. Aortic atherosclerosis.  Coronary artery calcification. Aortic Atherosclerosis (ICD10-I70.0). Electronically Signed   By: William  Stevens M.D.   On: 01/04/2018 01:06    Cardiac Studies   Pending  Patient Profile     62 y.o. male with untreated HTN, pre-diabetes, THC use presented to [facility] with chest pain suggestive of unstable angina. Troponin initially negative then 0.05-0.07-0.05. D-dimer 3.5 (ordered by EDP); CTA neg for PE but did show aortic atherosclerosis and coronary calcification. EKG suggestive of ischemia with QT prolongation as well.  Assessment & Plan    1. Chest pain  suggestive of unstable angina/possible small NSTEMI  - troponins are slightly elevated and CTA shows coronary calcification. EKG also indicative of ischemia. D/w Dr. Turner, plan cath. I put him on the add-on board for tomorrow. Risks and benefits of cardiac catheterization have been discussed with the patient.  These include bleeding, infection, kidney damage, stroke, heart attack, death.  The patient understands these risks and is willing to proceed. He is aware to notify nurse of any recurrent chest pain. He is not started on beta blocker given baseline bradycardia. Will increase newly initiated atorvastatin to 80mg qpm. If the patient is tolerating statin at time of follow-up appointment, would consider rechecking liver function/lipid panel in 6-8 weeks. Dr. Turner had intended for the patient to start on IV NTG but nurse note at 1737 yesterday states "discontinued by cardiology" (not clear why). Pt remains CP free   so will await rounding MD review for input on nitrate therapy -> may need additional BP control.  2. HTN - BP improving. See above.   3. Pre-diabetes - A1C 6.1, will need OP follow-up.  4. QT prolongation - ? Due to ischemia. No old tracings to compare to. K 3.5 on admission -> given 40meq KCl -> 3.9. Will give another 20meq today to keep K 4.0 or greater. Mg was 1.8 so will give 1g Mag Sulfate and recheck in AM to keep goal Mg 2.0 or greater. Avoid QT prolonging agents. He was not on any meds at home.  5. Elevated d-dimer - CTA neg for PE. No signs of DVT on exam. CT otherwise shows calcified granuloma in the right lung with calcified right hilar and mediastinal lymph nodes consistent with postinflammatory change. Also incidental kidney stone. This can be followed up by primary care. I discussed with patient.  For questions or updates, please contact [facility] Please consult [email or web address] for contact info under Cardiology/STEMI.  Signed, Altha Sweitzer N Ehan Freas, PA-C 01/04/2018, 8:10 AM    

## 2018-01-04 NOTE — Plan of Care (Signed)
  Problem: Coping: Goal: Level of anxiety will decrease Outcome: Completed/Met   Problem: Elimination: Goal: Will not experience complications related to bowel motility Outcome: Completed/Met   Problem: Pain Managment: Goal: General experience of comfort will improve Outcome: Completed/Met   Problem: Safety: Goal: Ability to remain free from injury will improve Outcome: Completed/Met   Problem: Skin Integrity: Goal: Risk for impaired skin integrity will decrease Outcome: Completed/Met   

## 2018-01-04 NOTE — Progress Notes (Addendum)
ANTICOAGULATION CONSULT NOTE   Pharmacy Consult for Heparin Indication: chest pain/ACS  No Known Allergies  Patient Measurements: Height: $RemoveBefo cm) Weight: 200 lb 4.8 oz (90.9 kg) IBW/kg (Calculated) : 75.3 Heparin Dosing Weight: 83.9 kg  Vital Signs: Temp: 98.4 F (36.9 C) (04/28 1032) Temp Source: Oral (04/28 1032) BP: 157/64 (04/28 1032) Pulse Rate: 60 (04/28 1032)  Labs: Recent Labs    01/03/18 1415 01/03/18 1935 01/04/18 0035 01/04/18 0513 01/04/18 0957  HGB 14.6  --   --  14.2  --   HCT 41.5  --   --  41.4  --   PLT 258  --   --  236  --   HEPARINUNFRC  --   --  0.21*  --  0.28*  CREATININE 0.98  --   --  1.07  --   TROPONINI  --  0.05* 0.07* 0.05*  --     Estimated Creatinine Clearance: 85.7 mL/min (by C-G formula based on SCr of 1.07 mg/dL).   Medical History: Past Medical History:  Diagnosis Date  . Hyperlipidemia   . Hypertension   . Prediabetes     Medications:  Scheduled:  . [START ON 01/05/2018] aspirin  81 mg Oral Pre-Cath  . aspirin EC  81 mg Oral Daily  . atorvastatin  80 mg Oral q1800  . lisinopril  10 mg Oral Daily  . sodium chloride flush  3 mL Intravenous Q12H  . sodium chloride flush  3 mL Intravenous Q12H    Assessment: 61 yom presenting with chest pain for 1 week. Received aspirin and sublingual nitro with EMS with complete relief of his symptoms. EKG showed NSR with T wave inversions. No anticoagulation prior to admission.  Hgb 14.2, plts 236. No signs/symptoms of bleeding. Troponin remains stable at 0.05.   Heparin level came back at 0.28, on 1200 units/hr, still slightly subtherapeutic. No signs/symptoms of bleeding. No infusion issues.  Goal of Therapy:  Heparin level 0.3-0.7 units/ml Monitor platelets by anticoagulation protocol: Yes   Plan:  Increase heparin to 1350 units/hr Obtain heparin level in 6 hours Monitor daily HL, CBC, s/sx of bleeding   Girard Cooter, PharmD Clinical Pharmacist  Pager:  [phone] Clinical Phone for 01/04/2018 until 3:30pm: [phone] If after 3:30pm, please call main pharmacy at [phone]  ADDENDUM Confirmatory heparin level is 0.37 after rate increase to 1350 units/hr. No infusion issues per nursing. Will continue at same rate and monitor with morning labs.   Girard Cooter, PharmD Clinical Pharmacist  Pager: [phone] Phone: [phone]

## 2018-01-04 NOTE — Progress Notes (Signed)
Consent form filled out, no confirmed provider scheduled/documented therefore cannot have patient sign the consent as it is incomplete.

## 2018-01-05 ENCOUNTER — Inpatient Hospital Stay (HOSPITAL_COMMUNITY): Payer: Non-veteran care

## 2018-01-05 ENCOUNTER — Other Ambulatory Visit (HOSPITAL_COMMUNITY): Payer: Self-pay | Admitting: Respiratory Therapy

## 2018-01-05 ENCOUNTER — Encounter (HOSPITAL_COMMUNITY)
Admission: EM | Disposition: A | Discharge status: Home / Self Care | Payer: Self-pay | Source: Home / Self Care | Attending: Cardiothoracic Surgery

## 2018-01-05 ENCOUNTER — Other Ambulatory Visit: Payer: Self-pay | Admitting: *Deleted

## 2018-01-05 DIAGNOSIS — Z8249 Family history of ischemic heart disease and other diseases of the circulatory system: Secondary | ICD-10-CM

## 2018-01-05 DIAGNOSIS — I10 Essential (primary) hypertension: Secondary | ICD-10-CM

## 2018-01-05 DIAGNOSIS — I25110 Atherosclerotic heart disease of native coronary artery with unstable angina pectoris: Secondary | ICD-10-CM

## 2018-01-05 DIAGNOSIS — Z01810 Encounter for preprocedural cardiovascular examination: Secondary | ICD-10-CM

## 2018-01-05 DIAGNOSIS — I2510 Atherosclerotic heart disease of native coronary artery without angina pectoris: Secondary | ICD-10-CM

## 2018-01-05 DIAGNOSIS — R079 Chest pain, unspecified: Secondary | ICD-10-CM

## 2018-01-05 HISTORY — PX: LEFT HEART CATH AND CORONARY ANGIOGRAPHY: CATH118249

## 2018-01-05 LAB — BASIC METABOLIC PANEL
ANION GAP: 5 (ref 5–15)
BUN: 9 mg/dL (ref 6–20)
CALCIUM: 8.8 mg/dL — AB (ref 8.9–10.3)
CO2: 29 mmol/L (ref 22–32)
CREATININE: 1.21 mg/dL (ref 0.61–1.24)
Chloride: 104 mmol/L (ref 101–111)
GLUCOSE: 134 mg/dL — AB (ref 65–99)
Potassium: 4.2 mmol/L (ref 3.5–5.1)
Sodium: 138 mmol/L (ref 135–145)

## 2018-01-05 LAB — PROTIME-INR
INR: 1.07
INR: 0.98
Prothrombin Time: 13.8 seconds (ref 11.4–15.2)
Prothrombin Time: 12.9 seconds (ref 11.4–15.2)

## 2018-01-05 LAB — ECHOCARDIOGRAM COMPLETE
Height: 71 in
Weight: 3249.6 oz

## 2018-01-05 LAB — COMPREHENSIVE METABOLIC PANEL
ALT: 13 U/L — ABNORMAL LOW (ref 17–63)
AST: 16 U/L (ref 15–41)
Albumin: 3.8 g/dL (ref 3.5–5.0)
Alkaline Phosphatase: 77 U/L (ref 38–126)
Anion gap: 9 (ref 5–15)
BUN: 11 mg/dL (ref 6–20)
CO2: 23 mmol/L (ref 22–32)
Calcium: 8.8 mg/dL — ABNORMAL LOW (ref 8.9–10.3)
Chloride: 104 mmol/L (ref 101–111)
Creatinine, Ser: 1.27 mg/dL — ABNORMAL HIGH (ref 0.61–1.24)
GFR calc Af Amer: >60 mL/min (ref >60)
GFR calc non Af Amer: >60 mL/min (ref >60)
Glucose, Bld: 136 mg/dL — ABNORMAL HIGH (ref 65–99)
Potassium: 3.6 mmol/L (ref 3.5–5.1)
Sodium: 136 mmol/L (ref 135–145)
Total Bilirubin: 0.5 mg/dL (ref 0.3–1.2)
Total Protein: 6.9 g/dL (ref 6.5–8.1)

## 2018-01-05 LAB — SPIROMETRY WITH GRAPH
FEF 25-75 Pre: 2.95 L/sec
FEF2575-%Pred-Pre: 100 %
FEV1-%Pred-Pre: 86 %
FEV1-Pre: 2.69 L
FEV1FVC-%Pred-Pre: 106 %
FEV6-%Pred-Pre: 83 %
FEV6-Pre: 3.24 L
FEV6FVC-%Pred-Pre: 104 %
FVC-%Pred-Pre: 80 %
FVC-Pre: 3.24 L
Pre FEV1/FVC ratio: 83 %
Pre FEV6/FVC Ratio: 100 %

## 2018-01-05 LAB — TYPE AND SCREEN
ABO/RH(D): A POS
Antibody Screen: NEGATIVE

## 2018-01-05 LAB — BLOOD GAS, ARTERIAL
Acid-Base Excess: 0.5 mmol/L (ref 0.0–2.0)
Bicarbonate: 24.2 mmol/L (ref 20.0–28.0)
Drawn by: 236041
FIO2: 21
O2 Saturation: 96.9 %
Patient temperature: 98.6
pCO2 arterial: 36.7 mmHg (ref 32.0–48.0)
pH, Arterial: 7.435 (ref 7.350–7.450)
pO2, Arterial: 91 mmHg (ref 83.0–108.0)

## 2018-01-05 LAB — HEMOGLOBIN A1C
Hgb A1c MFr Bld: 6 % — ABNORMAL HIGH (ref 4.8–5.6)
Mean Plasma Glucose: 125.5 mg/dL

## 2018-01-05 LAB — HEPARIN LEVEL (UNFRACTIONATED): HEPARIN UNFRACTIONATED: 0.54 [IU]/mL (ref 0.30–0.70)

## 2018-01-05 LAB — MAGNESIUM: MAGNESIUM: 1.9 mg/dL (ref 1.7–2.4)

## 2018-01-05 LAB — ABO/RH: ABO/RH(D): A POS

## 2018-01-05 LAB — MRSA PCR SCREENING: MRSA by PCR: NEGATIVE

## 2018-01-05 SURGERY — LEFT HEART CATH AND CORONARY ANGIOGRAPHY
Anesthesia: LOCAL

## 2018-01-05 MED ORDER — ACETAMINOPHEN 325 MG PO TABS
650.0000 mg | ORAL_TABLET | Freq: Four times a day (QID) | ORAL | Status: DC | PRN
Start: 2018-01-05 — End: 2018-01-12

## 2018-01-05 MED ORDER — LISINOPRIL 10 MG PO TABS: 10.0000 mg | ORAL_TABLET | Freq: Every day | ORAL | 11 refills | Status: DC

## 2018-01-05 MED ORDER — NITROGLYCERIN 0.4 MG SL SUBL: 0.4000 mg | SUBLINGUAL_TABLET | SUBLINGUAL | 2 refills | Status: DC | PRN

## 2018-01-05 MED ORDER — HEPARIN (PORCINE) IN NACL 100-0.45 UNIT/ML-% IJ SOLN
1350.0000 [IU]/h | INTRAMUSCULAR | Status: DC
  Administered 2018-01-05: 1350 [IU]/h via INTRAVENOUS
  Filled 2018-01-05: qty 250

## 2018-01-05 MED ORDER — HEPARIN (PORCINE) IN NACL 1000-0.9 UT/500ML-% IV SOLN
INTRAVENOUS | Status: AC
  Filled 2018-01-05: qty 1000

## 2018-01-05 MED ORDER — BISACODYL 5 MG PO TBEC: 5.0000 mg | DELAYED_RELEASE_TABLET | Freq: Once | ORAL | Status: DC

## 2018-01-05 MED ORDER — MIDAZOLAM HCL 2 MG/2ML IJ SOLN
INTRAMUSCULAR | Status: AC
  Filled 2018-01-05: qty 2

## 2018-01-05 MED ORDER — MAGNESIUM SULFATE 50 % IJ SOLN
40.0000 meq | INTRAMUSCULAR | Status: DC
  Filled 2018-01-05: qty 9.85

## 2018-01-05 MED ORDER — CHLORHEXIDINE GLUCONATE 0.12 % MT SOLN
15.0000 mL | Freq: Once | OROMUCOSAL | Status: AC
  Administered 2018-01-06: 15 mL via OROMUCOSAL
  Filled 2018-01-05: qty 15

## 2018-01-05 MED ORDER — HEPARIN (PORCINE) IN NACL 2-0.9 UNITS/ML
INTRAMUSCULAR | Status: AC | PRN
  Administered 2018-01-05 (×2): 500 mL

## 2018-01-05 MED ORDER — SODIUM CHLORIDE 0.9 % IV SOLN
750.0000 mg | INTRAVENOUS | Status: AC
  Administered 2018-01-06: 750 mg via INTRAVENOUS
  Filled 2018-01-05: qty 750

## 2018-01-05 MED ORDER — NITROGLYCERIN IN D5W 200-5 MCG/ML-% IV SOLN
INTRAVENOUS | Status: AC | PRN
  Administered 2018-01-05: 10 ug/min via INTRAVENOUS

## 2018-01-05 MED ORDER — SODIUM CHLORIDE 0.9 % IV SOLN
30.0000 ug/min | INTRAVENOUS | Status: DC
  Filled 2018-01-05: qty 2

## 2018-01-05 MED ORDER — HEPARIN SODIUM (PORCINE) 1000 UNIT/ML IJ SOLN
INTRAMUSCULAR | Status: AC
  Filled 2018-01-05: qty 1

## 2018-01-05 MED ORDER — ATORVASTATIN CALCIUM 80 MG PO TABS: 80.0000 mg | ORAL_TABLET | Freq: Every day | ORAL | Status: DC

## 2018-01-05 MED ORDER — EPINEPHRINE PF 1 MG/ML IJ SOLN
0.0000 ug/min | INTRAVENOUS | Status: DC
  Filled 2018-01-05: qty 4

## 2018-01-05 MED ORDER — LIDOCAINE HCL (PF) 1 % IJ SOLN
INTRAMUSCULAR | Status: DC | PRN
  Administered 2018-01-05: 2 mL

## 2018-01-05 MED ORDER — MILRINONE LACTATE IN DEXTROSE 20-5 MG/100ML-% IV SOLN
0.1250 ug/kg/min | INTRAVENOUS | Status: DC
  Filled 2018-01-05: qty 100

## 2018-01-05 MED ORDER — SODIUM CHLORIDE 0.9 % IV SOLN: 250.0000 mL | INTRAVENOUS | Status: DC | PRN

## 2018-01-05 MED ORDER — VANCOMYCIN HCL 10 G IV SOLR
1250.0000 mg | INTRAVENOUS | Status: AC
  Administered 2018-01-06: 1250 mg via INTRAVENOUS
  Filled 2018-01-05: qty 1250

## 2018-01-05 MED ORDER — ONDANSETRON HCL 4 MG/2ML IJ SOLN: 4.0000 mg | Freq: Four times a day (QID) | INTRAMUSCULAR | Status: DC | PRN

## 2018-01-05 MED ORDER — MIDAZOLAM HCL 2 MG/2ML IJ SOLN
INTRAMUSCULAR | Status: DC | PRN
  Administered 2018-01-05: 2 mg via INTRAVENOUS
  Administered 2018-01-05: 1 mg via INTRAVENOUS

## 2018-01-05 MED ORDER — POTASSIUM CHLORIDE 2 MEQ/ML IV SOLN
80.0000 meq | INTRAVENOUS | Status: DC
  Filled 2018-01-05: qty 40

## 2018-01-05 MED ORDER — SODIUM CHLORIDE 0.9 % IV SOLN
1.5000 g | INTRAVENOUS | Status: AC
  Administered 2018-01-06: 1.5 g via INTRAVENOUS
  Filled 2018-01-05: qty 1.5

## 2018-01-05 MED ORDER — TRANEXAMIC ACID (OHS) BOLUS VIA INFUSION
15.0000 mg/kg | INTRAVENOUS | Status: AC
  Administered 2018-01-06 (×2): 1381.5 mg via INTRAVENOUS
  Filled 2018-01-05: qty 1382

## 2018-01-05 MED ORDER — TEMAZEPAM 15 MG PO CAPS
15.0000 mg | ORAL_CAPSULE | Freq: Once | ORAL | Status: DC | PRN
  Filled 2018-01-05: qty 1

## 2018-01-05 MED ORDER — ACETAMINOPHEN 325 MG PO TABS: 650.0000 mg | ORAL_TABLET | ORAL | Status: DC | PRN

## 2018-01-05 MED ORDER — ISOSORBIDE MONONITRATE ER 30 MG PO TB24: 30.0000 mg | ORAL_TABLET | Freq: Every day | ORAL | 11 refills | Status: DC

## 2018-01-05 MED ORDER — SODIUM CHLORIDE 0.9 % IV SOLN
INTRAVENOUS | Status: DC
Start: 2018-01-05 — End: 2018-01-06
  Administered 2018-01-05 (×2): via INTRAVENOUS

## 2018-01-05 MED ORDER — ASPIRIN 81 MG PO CHEW: 81.0000 mg | CHEWABLE_TABLET | Freq: Every day | ORAL | Status: DC

## 2018-01-05 MED ORDER — DEXMEDETOMIDINE HCL IN NACL 400 MCG/100ML IV SOLN
0.1000 ug/kg/h | INTRAVENOUS | Status: AC
  Administered 2018-01-06: .4 ug/kg/h via INTRAVENOUS
  Filled 2018-01-05: qty 100

## 2018-01-05 MED ORDER — IOHEXOL 350 MG/ML SOLN
INTRAVENOUS | Status: DC | PRN
  Administered 2018-01-05: 125 mL via INTRA_ARTERIAL

## 2018-01-05 MED ORDER — NITROGLYCERIN IN D5W 200-5 MCG/ML-% IV SOLN
INTRAVENOUS | Status: AC
  Filled 2018-01-05: qty 250

## 2018-01-05 MED ORDER — DIAZEPAM 5 MG PO TABS: 5.0000 mg | ORAL_TABLET | Freq: Four times a day (QID) | ORAL | Status: DC | PRN

## 2018-01-05 MED ORDER — ASPIRIN 81 MG PO TBEC: 81.0000 mg | DELAYED_RELEASE_TABLET | Freq: Every day | ORAL | Status: DC

## 2018-01-05 MED ORDER — HEPARIN (PORCINE) IN NACL 100-0.45 UNIT/ML-% IJ SOLN: 1350.0000 [IU]/h | INTRAMUSCULAR | Status: DC

## 2018-01-05 MED ORDER — SODIUM CHLORIDE 0.9 % IV SOLN
INTRAVENOUS | Status: AC
  Administered 2018-01-06: 1.2 [IU]/h via INTRAVENOUS
  Filled 2018-01-05: qty 1

## 2018-01-05 MED ORDER — NITROGLYCERIN IN D5W 200-5 MCG/ML-% IV SOLN
2.0000 ug/min | INTRAVENOUS | Status: AC
  Administered 2018-01-06: 50 ug/min via INTRAVENOUS
  Filled 2018-01-05: qty 250

## 2018-01-05 MED ORDER — DOPAMINE-DEXTROSE 3.2-5 MG/ML-% IV SOLN
0.0000 ug/kg/min | INTRAVENOUS | Status: DC
  Filled 2018-01-05: qty 250

## 2018-01-05 MED ORDER — VERAPAMIL HCL 2.5 MG/ML IV SOLN
INTRAVENOUS | Status: AC
  Filled 2018-01-05: qty 2

## 2018-01-05 MED ORDER — VERAPAMIL HCL 2.5 MG/ML IV SOLN
INTRAVENOUS | Status: DC | PRN
  Administered 2018-01-05: 10 mL via INTRA_ARTERIAL

## 2018-01-05 MED ORDER — NITROGLYCERIN IN D5W 200-5 MCG/ML-% IV SOLN: 0.0000 ug/min | INTRAVENOUS | Status: DC

## 2018-01-05 MED ORDER — SODIUM CHLORIDE 0.9% FLUSH: 3.0000 mL | INTRAVENOUS | Status: DC | PRN

## 2018-01-05 MED ORDER — FENTANYL CITRATE (PF) 100 MCG/2ML IJ SOLN
INTRAMUSCULAR | Status: DC | PRN
  Administered 2018-01-05: 50 ug via INTRAVENOUS
  Administered 2018-01-05: 25 ug via INTRAVENOUS

## 2018-01-05 MED ORDER — LIDOCAINE HCL (PF) 1 % IJ SOLN
INTRAMUSCULAR | Status: AC
  Filled 2018-01-05: qty 30

## 2018-01-05 MED ORDER — FENTANYL CITRATE (PF) 100 MCG/2ML IJ SOLN
INTRAMUSCULAR | Status: AC
  Filled 2018-01-05: qty 2

## 2018-01-05 MED ORDER — SODIUM CHLORIDE 0.9% FLUSH: 3.0000 mL | Freq: Two times a day (BID) | INTRAVENOUS | Status: DC

## 2018-01-05 MED ORDER — PLASMA-LYTE 148 IV SOLN
INTRAVENOUS | Status: DC
  Filled 2018-01-05: qty 2.5

## 2018-01-05 MED ORDER — TRANEXAMIC ACID (OHS) PUMP PRIME SOLUTION
2.0000 mg/kg | INTRAVENOUS | Status: DC
  Filled 2018-01-05: qty 1.84

## 2018-01-05 MED ORDER — ISOSORBIDE MONONITRATE ER 30 MG PO TB24
30.0000 mg | ORAL_TABLET | Freq: Every day | ORAL | Status: DC
  Administered 2018-01-05: 30 mg via ORAL
  Filled 2018-01-05: qty 1

## 2018-01-05 MED ORDER — HEPARIN SODIUM (PORCINE) 1000 UNIT/ML IJ SOLN
INTRAMUSCULAR | Status: DC | PRN
  Administered 2018-01-05: 4600 [IU] via INTRAVENOUS

## 2018-01-05 MED ORDER — SODIUM CHLORIDE 0.9 % IV SOLN
INTRAVENOUS | Status: DC
  Filled 2018-01-05: qty 30

## 2018-01-05 MED ORDER — TRANEXAMIC ACID 1000 MG/10ML IV SOLN
1.5000 mg/kg/h | INTRAVENOUS | Status: AC
  Administered 2018-01-06: 1.5 mg/kg/h via INTRAVENOUS
  Filled 2018-01-05: qty 25

## 2018-01-05 MED ORDER — CHLORHEXIDINE GLUCONATE CLOTH 2 % EX PADS: 6.0000 | MEDICATED_PAD | Freq: Once | CUTANEOUS | Status: AC

## 2018-01-05 MED ORDER — METOPROLOL TARTRATE 12.5 MG HALF TABLET
12.5000 mg | ORAL_TABLET | Freq: Once | ORAL | Status: AC
  Administered 2018-01-06: 12.5 mg via ORAL
  Filled 2018-01-05: qty 1

## 2018-01-05 MED ORDER — CHLORHEXIDINE GLUCONATE CLOTH 2 % EX PADS
6.0000 | MEDICATED_PAD | Freq: Once | CUTANEOUS | Status: AC
  Administered 2018-01-05: 6 via TOPICAL

## 2018-01-05 MED ORDER — ATORVASTATIN CALCIUM 80 MG PO TABS: 80.0000 mg | ORAL_TABLET | Freq: Every day | ORAL | 11 refills | Status: DC

## 2018-01-05 SURGICAL SUPPLY — 18 items
CATH INFINITI 5 FR JL3.5 (CATHETERS) ×1 IMPLANT
CATH LAUNCHER 5F EBU3.0 (CATHETERS) IMPLANT
CATH LAUNCHER 5F RADL (CATHETERS) IMPLANT
CATH OPTITORQUE JACKY 4.0 5F (CATHETERS) ×1 IMPLANT
CATH OPTITORQUE TIG 4.0 5F (CATHETERS) ×1 IMPLANT
CATH VISTA GUIDE 6FR XBLAD3.5 (CATHETERS) ×1 IMPLANT
CATHETER LAUNCHER 5F EBU3.0 (CATHETERS) ×2
CATHETER LAUNCHER 5F RADL (CATHETERS) ×2
DEVICE RAD COMP TR BAND LRG (VASCULAR PRODUCTS) ×1 IMPLANT
GUIDEWIRE INQWIRE 1.5J.035X260 (WIRE) IMPLANT
INQWIRE 1.5J .035X260CM (WIRE) ×2
KIT HEART LEFT (KITS) ×2 IMPLANT
NDL PERC 21GX4CM (NEEDLE) IMPLANT
NEEDLE PERC 21GX4CM (NEEDLE) ×2 IMPLANT
PACK CARDIAC CATHETERIZATION (CUSTOM PROCEDURE TRAY) ×2 IMPLANT
SHEATH RAIN RADIAL 21G 6FR (SHEATH) ×1 IMPLANT
TRANSDUCER W/STOPCOCK (MISCELLANEOUS) ×2 IMPLANT
TUBING CIL FLEX 10 FLL-RA (TUBING) ×2 IMPLANT

## 2018-01-05 NOTE — Progress Notes (Addendum)
Progress Note  Patient Name: Cameron Camacho Date of Encounter: 01/05/2018  Primary Cardiologist: Armanda Magic, MD   Subjective   No chest pain overnight  Inpatient Medications    Scheduled Meds: . aspirin EC  81 mg Oral Daily  . atorvastatin  80 mg Oral q1800  . lisinopril  10 mg Oral Daily  . sodium chloride flush  3 mL Intravenous Q12H  . sodium chloride flush  3 mL Intravenous Q12H   Continuous Infusions: . sodium chloride    . sodium chloride    . sodium chloride 1 mL/kg/hr (01/05/18 0730)  . heparin 1,350 Units/hr (01/05/18 1012)  . nitroGLYCERIN     PRN Meds: sodium chloride, sodium chloride, acetaminophen, nitroGLYCERIN, sodium chloride flush, sodium chloride flush   Vital Signs    Vitals:   01/04/18 1306 01/04/18 2042 01/04/18 2300 01/05/18 0538  BP: (!) 122/59 (!) 144/81 (!) 157/74 (!) 157/62  Pulse: (!) 55 62 64 63  Resp: Temp: 98.4 F (36.9 C) 98.6 F (37 C) 98.3 F (36.8 C) 98 F (36.7 C)  TempSrc: Oral Oral Oral Oral  SpO2: 100% 100% 100%   Weight:    203 lb 1.6 oz (92.1 kg)  Height:        Intake/Output Summary (Last 24 hours) at 01/05/2018 1048 Last data filed at 01/05/2018 1015 Gross per 24 hour  Intake 1863.88 ml  Output 2925 ml  Net -1061.12 ml   Filed Weights   01/03/18 1410 01/03/18 2104 01/05/18 0538  Weight: 185 lb (83.9 kg) 200 lb 4.8 oz (90.9 kg) 203 lb 1.6 oz (92.1 kg)    Telemetry    NSR, 5 beats WCT last pm - Personally Reviewed  ECG    NSR, SB, inferior Qs - Personally Reviewed  Physical Exam   GEN: No acute distress.   Neck: No JVD Cardiac: RRR, no murmurs, rubs, or gallops.  Respiratory: Clear to auscultation bilaterally. GI: Soft, nontender, non-distended  MS: No edema; No deformity. Good radial pulse Neuro:  Nonfocal  Psych: Normal affect   Labs    Chemistry Recent Labs  Lab 01/03/18 1415 01/04/18 0513 01/05/18 0711  NA 138 138 138  K 3.5 3.9 4.2  CL 104 102 104  CO2 GLUCOSE 114* 126* 134*  BUN CREATININE 0.98 1.07 1.21  CALCIUM 8.7* 8.6* 8.8*  PROT 6.8  --   --   ALBUMIN 3.7  --   --   AST 19  --   --   ALT 17  --   --   ALKPHOS 72  --   --   BILITOT 0.8  --   --   GFRNONAA >60 >60 >60  GFRAA >60 >60 >60  ANIONGAP Hematology Recent Labs  Lab 01/03/18 1415 01/04/18 0513  WBC 7.2 7.2  RBC 4.41 4.42  HGB 14.6 14.2  HCT 41.5 41.4  MCV 94.1 93.7  MCH 33.1 32.1  MCHC 35.2 34.3  RDW 13.0 12.9  PLT 258 236    Cardiac Enzymes Recent Labs  Lab 01/03/18 1935 01/04/18 0035 01/04/18 0513  TROPONINI 0.05* 0.07* 0.05*    Recent Labs  Lab 01/03/18 1429  TROPIPOC 0.06     BNP Recent Labs  Lab 01/03/18 1609  BNP 102.8*     DDimer  Recent Labs  Lab 01/03/18 1609  DDIMER 3.50*     Radiology  Dg Chest 2 View  Result Date: 01/03/2018 CLINICAL DATA:  Chest pain for 1 week. Shortness of breath and wheezing. EXAM: CHEST - 2 VIEW COMPARISON:  None. FINDINGS: The heart size and mediastinal contours are within normal limits. Both lungs are clear. Mild eventration of left hemidiaphragm noted. The visualized skeletal structures are unremarkable. IMPRESSION: No active cardiopulmonary disease. Electronically Signed   By: Myles Rosenthal M.D.   On: 01/03/2018 15:10   Ct Angio Chest Pe W Or Wo Contrast  Result Date: 01/04/2018 CLINICAL DATA:  Anterior chest pain for a week. Shortness of breath. Elevated D-dimer. EXAM: CT ANGIOGRAPHY CHEST WITH CONTRAST TECHNIQUE: Multidetector CT imaging of the chest was performed using the standard protocol during bolus administration of intravenous contrast. Multiplanar CT image reconstructions and MIPs were obtained to evaluate the vascular anatomy. CONTRAST:  ISOVUE-370 IOPAMIDOL (ISOVUE-370) INJECTION 76% COMPARISON:  None. FINDINGS: Cardiovascular: Satisfactory opacification of the pulmonary arteries to the segmental level. No evidence of pulmonary embolism. Normal heart size. No  pericardial effusion. Coronary artery calcifications. Aortic calcifications. Mediastinum/Nodes: Calcified lymph nodes in the mediastinum and right hilum consistent with postinflammatory change. Esophagus is decompressed. Thyroid gland is unremarkable. Lungs/Pleura: Motion artifact limits evaluation. No definite airspace disease or consolidation. Mild dependent changes. Calcified granuloma in the right lung. No pleural effusions. No pneumothorax. Airways are patent. Upper Abdomen: Stone in the upper pole right kidney measuring 8 mm in diameter. No evidence of hydronephrosis. Musculoskeletal: Degenerative changes in the spine. No destructive bone lesions. Review of the MIP images confirms the above findings. IMPRESSION: 1. No evidence of significant pulmonary embolus. 2. Calcified granuloma in the right lung with calcified right hilar and mediastinal lymph nodes consistent with postinflammatory change. 3. Apparent nonobstructing stone in the right kidney. 4. Aortic atherosclerosis.  Coronary artery calcification. Aortic Atherosclerosis (ICD10-I70.0). Electronically Signed   By: Burman Nieves M.D.   On: 01/04/2018 01:06    Cardiac Studies     Patient Profile     62 y.o. male with untreated HTN and a family history of CAD, has been having exertional chest tightness for weeks, admitted 01/03/18 with rest pain associated with SOB and diaphoresis. Troponin slightly positive- 0.07.   Assessment & Plan    ACS- Pt with a history of typical angina, admitted with rest chest pain, Troponin pk 0.07  HTN- Previously untreated  Dyslipidemia- LDL 103  Pre DM- Hgb A1c-6.1, BS elevated  Family History  F- MI in his 3's  WCT- Pt had 5 beats of slow WCT  Elevated D-dimer- CTA negative for PE- remarkable for a right lung Ca++ granuloma, coronary Ca++, and a non obstructing kidney stone.   Plan: Cath today- continue ASA, statin, ACE. Not on BB secondary to bradycardia. HGb A1c was 6.1. Will ask for  diabetic teaching for diet. Needs a PCP after discharge.   For questions or updates, please contact [facility] Please consult [email or web address] for contact info under Cardiology/STEMI.      Jolene Provost, PA-C  01/05/2018, 10:48 AM

## 2018-01-05 NOTE — Progress Notes (Addendum)
ANTICOAGULATION CONSULT NOTE   Pharmacy Consult for Heparin Indication: chest pain/ACS  No Known Allergies  Patient Measurements: Height:  (180.3 cm) Weight: 203 lb 1.6 oz (92.1 kg) IBW/kg (Calculated) : 75.3 Heparin Dosing Weight: 83.9 kg  Vital Signs: Temp: 98 F (36.7 C) (04/29 0538) Temp Source: Oral (04/29 0538) BP: 147/73 (04/29 1400) Pulse Rate: 58 (04/29 1400)  Labs: Recent Labs    01/03/18 1415 01/03/18 1935  01/04/18 0035 01/04/18 0513 01/04/18 0957 01/04/18 1645 01/05/18 0711  HGB 14.6  --   --   --  14.2  --   --   --   HCT 41.5  --   --   --  41.4  --   --   --   PLT 258  --   --   --  236  --   --   --   LABPROT  --   --   --   --   --   --   --  13.8  INR  --   --   --   --   --   --   --  1.07  HEPARINUNFRC  --   --    < > 0.21*  --  0.28* 0.37 0.54  CREATININE 0.98  --   --   --  1.07  --   --  1.21  TROPONINI  --  0.05*  --  0.07* 0.05*  --   --   --    < > = values in this interval not displayed.    Estimated Creatinine Clearance: 76.2 mL/min (by C-G formula based on SCr of 1.21 mg/dL).   Medical History: Past Medical History:  Diagnosis Date  . Hyperlipidemia   . Hypertension   . Prediabetes     Medications:  Scheduled:  . aspirin EC  81 mg Oral Daily  . atorvastatin  80 mg Oral q1800  . lisinopril  10 mg Oral Daily  . 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 Pharmacy consulted to re-start heparin 8 hours post sheath removal (removed ~ 1pm; TR band placed) -last heparin rate was 1350 units/hr and heparin level= 0.54   Goal of Therapy:  Heparin level 0.3-0.7 units/ml Monitor platelets by anticoagulation protocol: Yes   Plan:  -Restart heparin at 9pm at 1350 units/hr -Heparin level in 8 hours and daily wth CBC daily  Harland German, PharmD Clinical Pharmacist 01/05/2018 3:23 PM  Addendum -heparin to  restart 4hrs post sheath pull (start time would be 5pm) -TR band removed ~ 5pm -Patient noted for CABG in am  Plan -Will start at 1350 units/hr  Harland German, PharmD Clinical Pharmacist 01/05/2018 5:56 PM

## 2018-01-05 NOTE — Progress Notes (Signed)
Pt expressed a lot of concern regarding plan for hospitalization. Pt very adamant about not wanting to have surgery and to go home. Patient is requesting to speak with a cardiologist ASAP. Patient stated "I don't want to go through with this. I've had a good life. I just don't want my chest to be cut open. I'm ready to go when it's my time." Active listening and emotional support given to patient. Paged cardiologist on call to come speak with patient. MD kelly aware and requesting Dr. Excell Seltzer to speak to patient.  TR band on. Nitro gtt running. VSS.

## 2018-01-05 NOTE — Progress Notes (Signed)
ANTICOAGULATION CONSULT NOTE   Pharmacy Consult for Heparin Indication: chest pain/ACS  No Known Allergies  Patient Measurements: Height:  (180.3 cm) Weight: 203 lb 1.6 oz (92.1 kg) IBW/kg (Calculated) : 75.3 Heparin Dosing Weight: 83.9 kg  Vital Signs: Temp: 98 F (36.7 C) (04/29 0538) Temp Source: Oral (04/29 0538) BP: 157/62 (04/29 0538) Pulse Rate: 63 (04/29 0538)  Labs: Recent Labs    01/03/18 1415 01/03/18 1935  01/04/18 0035 01/04/18 0513 01/04/18 0957 01/04/18 1645 01/05/18 0711  HGB 14.6  --   --   --  14.2  --   --   --   HCT 41.5  --   --   --  41.4  --   --   --   PLT 258  --   --   --  236  --   --   --   LABPROT  --   --   --   --   --   --   --  13.8  INR  --   --   --   --   --   --   --  1.07  HEPARINUNFRC  --   --    < > 0.21*  --  0.28* 0.37 0.54  CREATININE 0.98  --   --   --  1.07  --   --  1.21  TROPONINI  --  0.05*  --  0.07* 0.05*  --   --   --    < > = values in this interval not displayed.    Estimated Creatinine Clearance: 76.2 mL/min (by C-G formula based on SCr of 1.21 mg/dL).   Medical History: Past Medical History:  Diagnosis Date  . Hyperlipidemia   . Hypertension   . Prediabetes     Medications:  Scheduled:  . aspirin EC  81 mg Oral Daily  . atorvastatin  80 mg Oral q1800  . lisinopril  10 mg Oral Daily  . sodium chloride flush  3 mL Intravenous Q12H  . sodium chloride flush  3 mL Intravenous Q12H    Assessment: 61 yom presenting with chest pain for 1 week. Received aspirin and sublingual nitro with EMS with complete relief of his symptoms. EKG showed NSR with T wave inversions. No anticoagulation prior to admission.  Hgb 14.2, plts 236. No signs/symptoms of bleeding. Troponin remains stable at 0.05.   Heparin level came back at 0.54, on 1350 units/hr, still therapeutic. No signs/symptoms of bleeding. No infusion issues.  Goal of Therapy:  Heparin level 0.3-0.7 units/ml Monitor platelets by anticoagulation  protocol: Yes   Plan:  Continue heparin at 1350 units/hr Heparin level in AM Monitor daily HL, CBC, s/sx of bleeding   Harry Bark A. Jeanella Craze, PharmD, BCPS Clinical Pharmacist [place] Pager: [phone]  Clinical Phone for 01/04/2018 until 3:30pm: [phone] If after 3:30pm, please call main pharmacy at [phone]

## 2018-01-05 NOTE — Progress Notes (Signed)
Inpatient Diabetes Program Recommendations  AACE/ADA: New Consensus Statement on Inpatient Glycemic Control (2015)  Target Ranges:  Prepandial:   less than 140 mg/dL      Peak postprandial:   less than 180 mg/dL (1-2 hours)      Critically ill patients:  140 - 180 mg/dL   Lab Results  Component Value Date   HGBA1C 6.1 (H) 01/03/2018    Review of Glycemic Control  Diabetes history: Pre-diabetes Outpatient Diabetes medications: None Current orders for Inpatient glycemic control: None  For CABG in am - will be on IV insulin drip. Need Novolog correction insulin.  Inpatient Diabetes Program Recommendations:     Add Novolog 0-15 units Q4H.  Will talk with pt after surgery.  Thank you. Ailene Ards, RD, LDN, CDE Inpatient Diabetes Coordinator [phone]

## 2018-01-05 NOTE — Progress Notes (Addendum)
Called to see, pt not cooperating with nursing staff, says he wants to leave-"I can't let you cut on my chest". I spoke with the patient, not clear what brought on this complete turn around, he was quit pleasant this morning. I think he will agree to talk with the surgeon, if not I told him he would have to sign out AMA. Will add PO nitrates and Rx for SL NTG in case he leaves tonight AMA.  Corine Shelter PA-C 01/05/2018 3:25 PM  I spoke with patient at length. He understands critical anatomy requiring CABG and is willing to stay and be seen by TCTS for consultation. All of his questions are answered.   Tonny Bollman MD 01/05/2018 5:35 PM

## 2018-01-05 NOTE — Consult Note (Signed)
[address]       [place] [address]             [phone]        Cameron Camacho [facility] Medical Record #[id number] Date of Birth: [birth year]  Referring: No ref. provider found Primary Care: [facility] Primary Cardiologist:Traci Mayford Knife, MD  Chief Complaint:    Chief Complaint  Patient presents with  . Chest Pain    History of Present Illness:     Patient with no previous cardiac history notes increasing shortness of breath with exertion, he notes this especially when walking to work.  He was awoken approximately 2 in the morning on Friday night with chest discomfort.  This spontaneously resolved and he came to the emergency room on Saturday morning and was admitted for new onset of angina.  Cardiac catheterization was done this afternoon.  Patient has no previous cardiac history  Lab Results  Component Value Date   TROPONINI 0.05 (HH) 01/04/2018     Patient has a positive family history of cardiac disease, notes his father died at age 3 sudden death presumed to be a myocardial infarction.    Current Activity/ Functional Status: Patient is independent with mobility/ambulation, transfers, ADL's, IADL's.   Zubrod Score: At the time of surgery this patient's most appropriate activity status/level should be described as:     0    Normal activity, no symptoms     1    Restricted in physical strenuous activity but ambulatory, able to do out light work     2    Ambulatory and capable of self care, unable to do work activities, up and about                 more than 50%  Of the time                                3    Only limited self care, in bed greater than 50% of waking hours     4    Completely disabled, no self care, confined to bed or chair     5    Moribund  Past Medical History:  Diagnosis Date  . Hyperlipidemia   . Hypertension   . Prediabetes     History reviewed. No pertinent surgical history.  Social  History   Tobacco Use  Smoking Status Passive Smoke Exposure - Never Smoker  Smokeless Tobacco Never Used    Social History   Substance and Sexual Activity  Alcohol Use Not Currently  . Frequency: Never    Social History   Socioeconomic History  . Marital status: Married    Spouse name: Not on file  . Number of children: Not on file  . Years of education: Not on file  . Highest education level: Not on file  Occupational History  .  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 Smoking status: Passive Smoke Exposure - Never Smoker  . Smokeless tobacco: Never Used  Substance and Sexual Activity  . Alcohol use: Not Currently    Frequency: Never  . Drug use: Yes    Types: Marijuana    Comment: Occasional THC for pain  . Sexual activity: Not on file  No Known Allergies  Current Facility-Administered Medications  Medication Dose Route Frequency Provider Last Rate Last Dose  . 0.9 %  sodium chloride infusion  250 mL Intravenous PRN Ronie Spies N, PA-C   Stopped at 01/05/18 1340  . 0.9 %  sodium chloride infusion   Intravenous Continuous Lennette Bihari, MD 125 mL/hr at 01/05/18 1400    . 0.9 %  sodium chloride infusion  250 mL Intravenous PRN Lennette Bihari, MD      . acetaminophen (TYLENOL) tablet 650 mg  650 mg Oral Q4H PRN Dunn, Dayna N, PA-C      . aspirin EC tablet 81 mg  81 mg Oral Daily Dunn, Dayna N, PA-C   81 mg at 01/05/18 1114  . atorvastatin (LIPITOR) tablet 80 mg  80 mg Oral q1800 Dunn, Dayna N, PA-C   80 mg at 01/04/18 1741  . [START ON 01/06/2018] cefUROXime (ZINACEF) 1.5 g in sodium chloride 0.9 % 100 mL IVPB  1.5 g Intravenous To OR Delight Ovens, MD      . Melene Muller ON 01/06/2018] cefUROXime (ZINACEF) 750 mg in sodium chloride 0.9 % 100 mL IVPB  750 mg Intravenous To OR Delight Ovens, MD      . Melene Muller ON 01/06/2018] dexmedetomidine  (PRECEDEX) 400 MCG/100ML (4 mcg/mL) infusion  0.1-0.7 mcg/kg/hr Intravenous To OR Delight Ovens, MD      . diazepam (VALIUM) tablet 5 mg  5 mg Oral Q6H PRN Lennette Bihari, MD      . Melene Muller ON 01/06/2018] DOPamine (INTROPIN) 800 mg in dextrose 5 % 250 mL (3.2 mg/mL) infusion  0-10 mcg/kg/min Intravenous To OR Delight Ovens, MD      . Melene Muller ON 01/06/2018] EPINEPHrine (ADRENALIN) 4 mg in dextrose 5 % 250 mL (0.016 mg/mL) infusion  0-10 mcg/min Intravenous To OR Delight Ovens, MD      . Melene Muller ON 01/06/2018] heparin 2,500 Units, papaverine 30 mg in electrolyte-148 (PLASMALYTE-148) 500 mL irrigation   Irrigation To OR Delight Ovens, MD      . Melene Muller ON 01/06/2018] heparin 30,000 units/NS 1000 mL solution for CELLSAVER   Other To OR Delight Ovens, MD      . heparin ADULT infusion 100 units/mL (25000 units/242mL sodium chloride 0.45%)  1,350 Units/hr Intravenous Continuous Silvana Newness, RPH      . [START ON 01/06/2018] insulin regular (NOVOLIN R,HUMULIN R) 100 Units in sodium chloride 0.9 % 100 mL (1 Units/mL) infusion   Intravenous To OR Delight Ovens, MD      . isosorbide mononitrate (IMDUR) 24 hr tablet 30 mg  30 mg Oral Daily Corine Shelter K, PA-C   30 mg at 01/05/18 1539  . lisinopril (PRINIVIL,ZESTRIL) tablet 10 mg  10 mg Oral Daily Dunn, Dayna N, PA-C   10 mg at 01/05/18 1114  . [START ON 01/06/2018] magnesium sulfate (IV Push/IM) injection 40 mEq  40 mEq Other To OR Delight Ovens, MD      . Melene Muller ON 01/06/2018] milrinone (PRIMACOR) 20 MG/100 ML (0.2 mg/mL) infusion  0.125 mcg/kg/min Intravenous To OR Delight Ovens, MD      . nitroGLYCERIN (NITROSTAT) SL tablet 0.4 mg  0.4 mg Sublingual Q5 Min x 3 PRN Dunn, Dayna N, PA-C      . [START ON 01/06/2018] nitroGLYCERIN 50 mg in dextrose 5 % 250 mL (0.2 mg/mL) infusion  2-200 mcg/min Intravenous To OR Delight Ovens, MD      .  ondansetron (ZOFRAN) injection 4 mg  4 mg Intravenous Q6H PRN Lennette Bihari, MD      .  Melene Muller ON 01/06/2018] phenylephrine (NEO-SYNEPHRINE) 20 mg in sodium chloride 0.9 % 250 mL (0.08 mg/mL) infusion  30-200 mcg/min Intravenous To OR Delight Ovens, MD      . Melene Muller ON 01/06/2018] potassium chloride injection 80 mEq  80 mEq Other To OR Delight Ovens, MD      . sodium chloride flush (NS) 0.9 % injection 3 mL  3 mL Intravenous Q12H Dunn, Dayna N, PA-C   3 mL at 01/04/18 2034  . sodium chloride flush (NS) 0.9 % injection 3 mL  3 mL Intravenous PRN Dunn, Dayna N, PA-C      . [START ON 01/06/2018] tranexamic acid (CYKLOKAPRON) 2,500 mg in sodium chloride 0.9 % 250 mL (10 mg/mL) infusion  1.5 mg/kg/hr Intravenous To OR Delight Ovens, MD      . Melene Muller ON 01/06/2018] tranexamic acid (CYKLOKAPRON) bolus via infusion - over 30 minutes 1,381.5 mg  15 mg/kg Intravenous To OR Delight Ovens, MD      . Melene Muller ON 01/06/2018] tranexamic acid (CYKLOKAPRON) pump prime solution 184 mg  2 mg/kg Intracatheter To OR Delight Ovens, MD      . Melene Muller ON 01/06/2018] vancomycin (VANCOCIN) 1,250 mg in sodium chloride 0.9 % 250 mL IVPB  1,250 mg Intravenous To OR Delight Ovens, MD        Medications Prior to Admission  Medication Sig Dispense Refill Last Dose  . APPLE CIDER VINEGAR PO Take 15 mLs by mouth daily. Mix in 8 oz water and drink   01/03/2018 at am  . aspirin 325 MG tablet Take 650 mg by mouth once.   01/03/2018 at 1200  . Garlic 1000 MG CAPS Take 1,000 mg by mouth daily.   01/02/2018 at am  . Naphazoline HCl (CLEAR EYES OP) Place 1 drop into both eyes daily as needed (tired eyes).   01/02/2018 at Unknown time  . naproxen sodium (ALEVE) 220 MG tablet Take 220 mg by mouth 2 (two) times daily as needed (pain).   few days ago  . oxymetazoline (AFRIN) 0.05 % nasal spray Place 1 spray into both nostrils 2 (two) times daily.   01/03/2018 at am    Family History  Problem Relation Age of Onset  . Hypertension Mother   . CAD Father        Heart attack mid 47s  . Hypertension Brother       Review of Systems:   Pertinent items are noted in HPI.     Cardiac Review of Systems: Y or  [    ]= no  Chest Pain [   y ]  Resting SOB [ n  ] Exertional SOB  Cove.Etienne ]  Orthopnea Milo.Brash  ]   Pedal Edema [  n ]    Palpitations [ n ] Syncope  [  n]   Presyncope [ n  ]  General Review of Systems: [Y] = yes [  ]=no Constitional: recent weight change [  ]; anorexia [  ]; fatigue [  ]; nausea [  ]; night sweats [  ]; fever [  ]; or chills [  ]  Dental: poor dentition[  ]; Last Dentist visit:   Eye : blurred vision [  ]; diplopia [   ]; vision changes [  ];  Amaurosis fugax[  ]; Resp: cough [  ];  wheezing[  ];  hemoptysis[  ]; shortness of breath[  ]; paroxysmal nocturnal dyspnea[  ]; dyspnea on exertion[  ]; or orthopnea[  ];  GI:  gallstones[  ], vomiting[  ];  dysphagia[  ]; melena[  ];  hematochezia [  ]; heartburn[  ];   Hx of  Colonoscopy[  ]; GU: kidney stones [  ]; hematuria[  ];   dysuria [  ];  nocturia[  ];  history of     obstruction [  ]; urinary frequency [  ]             Skin: rash, swelling[  ];, hair loss[  ];  peripheral edema[  ];  or itching[  ]; Musculosketetal: myalgias[  ];  joint swelling[  ];  joint erythema[  ];  joint pain[  ];  back pain[  ];  Heme/Lymph: bruising[  ];  bleeding[  ];  anemia[  ];  Neuro: TIA[  ];  headaches[  ];  stroke[  ];  vertigo[  ];  seizures[  ];   paresthesias[  ];  difficulty walking[  ];  Psych:depression[  ]; anxiety[  ];  Endocrine: diabetes[  ];  thyroid dysfunction[  ];  Immunizations: Flu [  ]; Pneumococcal[  ];    Physical Exam: BP (!) 147/73   Pulse 64   Temp 98 F (36.7 C) (Oral)   Resp (!) 21   Ht 5\' 11"  (1.803 m)   Wt 203 lb 1.6 oz (92.1 kg)   SpO2 99%   BMI 28.33 kg/m    General appearance: alert, cooperative, appears stated age and no distress Head: Normocephalic, without obvious abnormality, atraumatic Neck: no adenopathy, no carotid bruit, no JVD, supple,  symmetrical, trachea midline and thyroid not enlarged, symmetric, no tenderness/mass/nodules Lymph nodes: Cervical, supraclavicular, and axillary nodes normal. Resp: clear to auscultation bilaterally Back: symmetric, no curvature. ROM normal. No CVA tenderness. Cardio: regular rate and rhythm, S1, S2 normal, no murmur, click, rub or gallop GI: soft, non-tender; bowel sounds normal; no masses,  no organomegaly Extremities: extremities normal, atraumatic, no cyanosis or edema and Homans sign is negative, no sign of DVT Neurologic: Grossly normal Patient is left-handed, was cath through the right radial, radial cath site is intact and is neurovascular intact  Diagnostic Studies & Laboratory data:     Recent Radiology Findings:   Ct Angio Chest Pe W Or Wo Contrast  Result Date: 01/04/2018 CLINICAL DATA:  Anterior chest pain for a week. Shortness of breath. Elevated D-dimer. EXAM: CT ANGIOGRAPHY CHEST WITH CONTRAST TECHNIQUE: Multidetector CT imaging of the chest was performed using the standard protocol during bolus administration of intravenous contrast. Multiplanar CT image reconstructions and MIPs were obtained to evaluate the vascular anatomy. CONTRAST:  ISOVUE-370 IOPAMIDOL (ISOVUE-370) INJECTION 76% COMPARISON:  None. FINDINGS: Cardiovascular: Satisfactory opacification of the pulmonary arteries to the segmental level. No evidence of pulmonary embolism. Normal heart size. No pericardial effusion. Coronary artery calcifications. Aortic calcifications. Mediastinum/Nodes: Calcified lymph nodes in the mediastinum and right hilum consistent with postinflammatory change. Esophagus is decompressed. Thyroid gland is unremarkable. Lungs/Pleura: Motion artifact limits evaluation. No definite airspace disease or consolidation. Mild dependent changes. Calcified granuloma in the right lung. No pleural effusions. No pneumothorax. Airways are patent. Upper Abdomen: Larina Bras  in the upper pole right kidney  measuring 8 mm in diameter. No evidence of hydronephrosis. Musculoskeletal: Degenerative changes in the spine. No destructive bone lesions. Review of the MIP images confirms the above findings. IMPRESSION: 1. No evidence of significant pulmonary embolus. 2. Calcified granuloma in the right lung with calcified right hilar and mediastinal lymph nodes consistent with postinflammatory change. 3. Apparent nonobstructing stone in the right kidney. 4. Aortic atherosclerosis.  Coronary artery calcification. Aortic Atherosclerosis (ICD10-I70.0). Electronically Signed   By: Burman Nieves M.D.   On: 01/04/2018 01:06     I have independently reviewed the above radiologic studies.  Recent Lab Findings: Lab Results  Component Value Date   WBC 7.2 01/04/2018   HGB 14.2 01/04/2018   HCT 41.4 01/04/2018   PLT 236 01/04/2018   GLUCOSE 134 (H) 01/05/2018   CHOL 169 01/04/2018   TRIG 51 01/04/2018   HDL 56 01/04/2018   LDLCALC 103 (H) 01/04/2018   ALT 17 01/03/2018   AST 19 01/03/2018   NA 138 01/05/2018   K 4.2 01/05/2018   CL 104 01/05/2018   CREATININE 1.21 01/05/2018   BUN 9 01/05/2018   CO2 29 01/05/2018   TSH 1.405 01/03/2018   INR 1.07 01/05/2018   HGBA1C 6.1 (H) 01/03/2018   CATH: Procedures   LEFT HEART CATH AND CORONARY ANGIOGRAPHY  Conclusion     Dist LM to Ost LAD lesion is 95% stenosed.  Mid LM to Dist LM lesion is 90% stenosed.  Ost Cx to Prox Cx lesion is 70% stenosed.  Prox RCA lesion is 20% stenosed.  Mid RCA lesion is 70% stenosed.  Dist RCA lesion is 50% stenosed.  Ost RPDA lesion is 60% stenosed.  RPDA lesion is 55% stenosed.   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  LVEDP 13 mmHg.  RECOMMENDATION: CABG revascularization.  Intervenous  nitroglycerin was started at the end of the procedure.  Surgical consultation will be obtained.  The patient will be started on heparin 8 hours post radial sheath removal.     I have independently reviewed the above  cath films and reviewed the findings with the  patient .     Assessment / Plan:   New onset of angina with high-grade distal left main obstruction-after reviewing the films I concur with Dr. Excell Seltzer that the patient needs coronary artery bypass grafting.  When initially told by cardiology the patient was very resistant to even discussing surgery with me.  After further explanation of his risks and options with Dr. Excell Seltzer he was not agreeable to discuss surgical options.  I explained in detail to the patient and reviewed the cath films with him.  Agree with the recommendation and cardiology discussed with him proceeding with coronary artery bypass grafting.  With his anatomic situation bypass offers the best chance for relief of symptoms and preservation of life with the degree of left main disease.  These recommendations were shared with the patient.  Risks and options and expectations of postop care were reviewed with him.  The goals risks and alternatives of the planned surgical procedure Procedure(s): LEFT HEART CATH AND CORONARY ANGIOGRAPHY (N/A)  have been discussed with the patient in detail. The risks of the procedure including death, infection, stroke, myocardial infarction, bleeding, blood transfusion have all been  discussed specifically.  I have quoted Cameron Camacho a 2% of perioperative mortality and a complication rate as high as 40 %. The patient's questions have been answered.Cameron Camacho is willing  to proceed with the planned procedure.  We will proceed with coronary artery bypass grafting first thing in the morning.  When I saw the patient this evening he was eating dinner.  He is to be resumed on heparin.   I  spent counseling the patient face to face.  15  minutes were spent reviewing Cath Lab films CT and discussing case with Dr. Excell Seltzer.    Delight Ovens MD      [address] [place] [place] [address] Office [phone]   Beeper [phone]  01/05/2018 4:40 PM

## 2018-01-05 NOTE — Anesthesia Preprocedure Evaluation (Addendum)
Anesthesia Evaluation  Patient identified by MRN, date of birth, ID band Patient awake    Reviewed: Allergy & Precautions, NPO status , Patient's Chart, lab work & pertinent test results  Airway Mallampati: II  TM Distance: >3 FB Neck ROM: Full    Dental  (+) Dental Advisory Given, Loose, Missing   Pulmonary neg pulmonary ROS,    Pulmonary exam normal breath sounds clear to auscultation       Cardiovascular Exercise Tolerance: Good hypertension, Pt. on medications + angina + CAD  Normal cardiovascular exam Rhythm:Regular Rate:Normal  Echo 01/05/18: Study Conclusions  - Left ventricle: The cavity size was normal. Wall thickness was increased in a pattern of mild LVH. Systolic function was vigorous. The estimated ejection fraction was in the range of 65% to 70%. Wall motion was normal; there were no regional wall motion abnormalities. Doppler parameters are consistent with abnormal left ventricular relaxation (grade 1 diastolic dysfunction). - Aortic valve: Trileaflet; mildly thickened, moderately calcified leaflets (left cusp). - Mitral valve: Mildly thickened leaflets . There was trivial   Regurgitation.  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   Neuro/Psych negative neurological ROS  negative psych ROS   GI/Hepatic negative GI ROS, Neg liver ROS,   Endo/Other  negative endocrine ROS  Renal/GU negative Renal ROS     Musculoskeletal negative musculoskeletal ROS (+)   Abdominal   Peds  Hematology negative hematology ROS (+)   Anesthesia Other Findings Day of surgery medications reviewed with the patient.  Reproductive/Obstetrics                            Anesthesia  Physical Anesthesia Plan  ASA: IV  Anesthesia Plan: General   Post-op Pain Management:    Induction: Intravenous  PONV Risk Score and Plan: 2 and Dexamethasone, Ondansetron and Midazolam  Airway Management Planned: Oral ETT  Additional Equipment: Arterial line, CVP, PA Cath, TEE and Ultrasound Guidance Line Placement  Intra-op Plan:   Post-operative Plan: Post-operative intubation/ventilation  Informed Consent: I have reviewed the patients History and Physical, chart, labs and discussed the procedure including the risks, benefits and alternatives for the proposed anesthesia with the patient or authorized representative who has indicated his/her understanding and acceptance.   Dental advisory given  Plan Discussed with: CRNA  Anesthesia Plan Comments:        Anesthesia Quick Evaluation

## 2018-01-05 NOTE — Progress Notes (Signed)
  Echocardiogram 2D Echocardiogram has been attempted. Patient refused exam. Patient asked to have exam done tomorrow.  Quron Ruddy G Roni Friberg 01/05/2018, 3:30 PM

## 2018-01-05 NOTE — Progress Notes (Signed)
  Echocardiogram 2D Echocardiogram has been performed.  Cameron Camacho 01/05/2018, 8:05 PM

## 2018-01-05 NOTE — Progress Notes (Signed)
Pt found out of bed in chair at bedside. Explained the importance of bed rest post cath, and not using R wrist. Pt refusing blood pressure to be taken. Nitro gtt stopped at this time. Still removing air from TR band. Site level 0. Paged cardiologist to come speak with patient.  Will continue to monitor.

## 2018-01-05 NOTE — Interval H&P Note (Signed)
Cath Lab Visit (complete for each Cath Lab visit)  Clinical Evaluation Leading to the Procedure:   ACS: No.  Non-ACS:    Anginal Classification: CCS III  Anti-ischemic medical therapy: No Therapy  Non-Invasive Test Results: No non-invasive testing performed  Prior CABG: No previous CABG      History and Physical Interval Note:  01/05/2018 11:58 AM  Cameron Camacho  has presented today for surgery, with the diagnosis of unstable angina  The various methods of treatment have been discussed with the patient and family. After consideration of risks, benefits and other options for treatment, the patient has consented to  Procedure(s): LEFT HEART CATH AND CORONARY ANGIOGRAPHY (N/A) as a surgical intervention .  The patient's history has been reviewed, patient examined, no change in status, stable for surgery.  I have reviewed the patient's chart and labs.  Questions were answered to the patient's satisfaction.     Nicki Guadalajara

## 2018-01-05 NOTE — Progress Notes (Signed)
Pt has been npo except sips with meds, pt to cath lab via bed in stable conditon, pt has no c/o cp or sob

## 2018-01-05 NOTE — Progress Notes (Signed)
Patient refused to have RN shave groin. States MD told him cath would be done in radial. Consent signed. No other complaints at this time. Will continue to monitor patient.

## 2018-01-05 NOTE — Progress Notes (Addendum)
Pre-op Cardiac Surgery    Upper Extremity Right Left  Brachial Pressures 146 156  Radial Waveforms triphasic triphasic  Ulnar Waveforms triphasic triphasic  Palmar Arch (Allen's Test) WNL patent WNL patent    Lower  Extremity Right Left  Dorsalis Pedis biphsic triphasic  Anterior Tibial    Posterior Tibial biphasic triphasic  Ankle/Brachial Indices 0.81 1.04    Hongying Nyanna Heideman (RDMS RVT) 01/05/18 6:05 PM

## 2018-01-05 NOTE — Progress Notes (Signed)
Pre op cardiac surgery. Carotid duplex prelim: 1-39% ICA stenosis. Farrel Demark, RDMS, RVT

## 2018-01-05 NOTE — Progress Notes (Signed)
Patient refusing vascular ultrasound Pre CABG testing at this time. 4:27 PM   Farrel Demark, RDMS, RVT

## 2018-01-06 ENCOUNTER — Encounter (HOSPITAL_COMMUNITY): Payer: Non-veteran care

## 2018-01-06 ENCOUNTER — Inpatient Hospital Stay (HOSPITAL_COMMUNITY): Payer: Non-veteran care

## 2018-01-06 ENCOUNTER — Encounter (HOSPITAL_COMMUNITY)
Admission: EM | Disposition: A | Discharge status: Home / Self Care | Payer: Self-pay | Source: Home / Self Care | Attending: Cardiothoracic Surgery

## 2018-01-06 ENCOUNTER — Other Ambulatory Visit (HOSPITAL_COMMUNITY): Payer: Non-veteran care

## 2018-01-06 ENCOUNTER — Encounter (HOSPITAL_COMMUNITY): Payer: Self-pay | Admitting: Certified Registered"

## 2018-01-06 DIAGNOSIS — Z951 Presence of aortocoronary bypass graft: Secondary | ICD-10-CM

## 2018-01-06 DIAGNOSIS — I25110 Atherosclerotic heart disease of native coronary artery with unstable angina pectoris: Secondary | ICD-10-CM | POA: Diagnosis not present

## 2018-01-06 HISTORY — PX: TEE WITHOUT CARDIOVERSION: SHX5443

## 2018-01-06 HISTORY — PX: CORONARY ARTERY BYPASS GRAFT: SHX141

## 2018-01-06 LAB — POCT I-STAT 3, ART BLOOD GAS (G3+)
ACID-BASE DEFICIT: 1 mmol/L (ref 0.0–2.0)
ACID-BASE EXCESS: 1 mmol/L (ref 0.0–2.0)
ACID-BASE EXCESS: 1 mmol/L (ref 0.0–2.0)
Acid-base deficit: 3 mmol/L — ABNORMAL HIGH (ref 0.0–2.0)
Acid-base deficit: 5 mmol/L — ABNORMAL HIGH (ref 0.0–2.0)
BICARBONATE: 26 mmol/L (ref 20.0–28.0)
BICARBONATE: 24.9 mmol/L (ref 20.0–28.0)
BICARBONATE: 22.6 mmol/L (ref 20.0–28.0)
BICARBONATE: 20.5 mmol/L (ref 20.0–28.0)
Bicarbonate: 26.6 mmol/L (ref 20.0–28.0)
O2 SAT: 100 %
O2 SAT: 100 %
O2 SAT: 96 %
O2 Saturation: 99 %
O2 Saturation: 99 %
PCO2 ART: 41.8 mmHg (ref 32.0–48.0)
PCO2 ART: 46.3 mmHg (ref 32.0–48.0)
PCO2 ART: 41.4 mmHg (ref 32.0–48.0)
PCO2 ART: 36.7 mmHg (ref 32.0–48.0)
PH ART: 7.34 — AB (ref 7.350–7.450)
PO2 ART: 389 mmHg — AB (ref 83.0–108.0)
PO2 ART: 146 mmHg — AB (ref 83.0–108.0)
PO2 ART: 86 mmHg (ref 83.0–108.0)
Patient temperature: 35.9
Patient temperature: 36.2
TCO2: 27 mmol/L (ref 22–32)
TCO2: 28 mmol/L (ref 22–32)
TCO2: 26 mmol/L (ref 22–32)
TCO2: 24 mmol/L (ref 22–32)
TCO2: 22 mmol/L (ref 22–32)
pCO2 arterial: 44.3 mmHg (ref 32.0–48.0)
pH, Arterial: 7.402 (ref 7.350–7.450)
pH, Arterial: 7.386 (ref 7.350–7.450)
pH, Arterial: 7.334 — ABNORMAL LOW (ref 7.350–7.450)
pH, Arterial: 7.352 (ref 7.350–7.450)
pO2, Arterial: 343 mmHg — ABNORMAL HIGH (ref 83.0–108.0)
pO2, Arterial: 141 mmHg — ABNORMAL HIGH (ref 83.0–108.0)

## 2018-01-06 LAB — POCT I-STAT, CHEM 8
BUN: 9 mg/dL (ref 6–20)
BUN: 8 mg/dL (ref 6–20)
BUN: 8 mg/dL (ref 6–20)
BUN: 7 mg/dL (ref 6–20)
BUN: 7 mg/dL (ref 6–20)
BUN: 7 mg/dL (ref 6–20)
CALCIUM ION: 0.98 mmol/L — AB (ref 1.15–1.40)
CALCIUM ION: 1.06 mmol/L — AB (ref 1.15–1.40)
CALCIUM ION: 1.07 mmol/L — AB (ref 1.15–1.40)
CHLORIDE: 102 mmol/L (ref 101–111)
CHLORIDE: 104 mmol/L (ref 101–111)
CHLORIDE: 102 mmol/L (ref 101–111)
CHLORIDE: 102 mmol/L (ref 101–111)
CHLORIDE: 104 mmol/L (ref 101–111)
CREATININE: 0.9 mg/dL (ref 0.61–1.24)
CREATININE: 0.8 mg/dL (ref 0.61–1.24)
Calcium, Ion: 1.12 mmol/L — ABNORMAL LOW (ref 1.15–1.40)
Calcium, Ion: 1.18 mmol/L (ref 1.15–1.40)
Calcium, Ion: 1.05 mmol/L — ABNORMAL LOW (ref 1.15–1.40)
Chloride: 102 mmol/L (ref 101–111)
Creatinine, Ser: 0.9 mg/dL (ref 0.61–1.24)
Creatinine, Ser: 0.9 mg/dL (ref 0.61–1.24)
Creatinine, Ser: 0.8 mg/dL (ref 0.61–1.24)
Creatinine, Ser: 0.9 mg/dL (ref 0.61–1.24)
GLUCOSE: 167 mg/dL — AB (ref 65–99)
GLUCOSE: 144 mg/dL — AB (ref 65–99)
Glucose, Bld: 121 mg/dL — ABNORMAL HIGH (ref 65–99)
Glucose, Bld: 129 mg/dL — ABNORMAL HIGH (ref 65–99)
Glucose, Bld: 130 mg/dL — ABNORMAL HIGH (ref 65–99)
Glucose, Bld: 121 mg/dL — ABNORMAL HIGH (ref 65–99)
HCT: 31 % — ABNORMAL LOW (ref 39.0–52.0)
HCT: 31 % — ABNORMAL LOW (ref 39.0–52.0)
HCT: 30 % — ABNORMAL LOW (ref 39.0–52.0)
HCT: 31 % — ABNORMAL LOW (ref 39.0–52.0)
HEMATOCRIT: 35 % — AB (ref 39.0–52.0)
HEMATOCRIT: 33 % — AB (ref 39.0–52.0)
HEMOGLOBIN: 10.2 g/dL — AB (ref 13.0–17.0)
Hemoglobin: 11.9 g/dL — ABNORMAL LOW (ref 13.0–17.0)
Hemoglobin: 11.2 g/dL — ABNORMAL LOW (ref 13.0–17.0)
Hemoglobin: 10.5 g/dL — ABNORMAL LOW (ref 13.0–17.0)
Hemoglobin: 10.5 g/dL — ABNORMAL LOW (ref 13.0–17.0)
Hemoglobin: 10.5 g/dL — ABNORMAL LOW (ref 13.0–17.0)
POTASSIUM: 3.8 mmol/L (ref 3.5–5.1)
POTASSIUM: 3.8 mmol/L (ref 3.5–5.1)
POTASSIUM: 4.4 mmol/L (ref 3.5–5.1)
Potassium: 4.2 mmol/L (ref 3.5–5.1)
Potassium: 3.8 mmol/L (ref 3.5–5.1)
Potassium: 3.6 mmol/L (ref 3.5–5.1)
SODIUM: 140 mmol/L (ref 135–145)
SODIUM: 140 mmol/L (ref 135–145)
SODIUM: 139 mmol/L (ref 135–145)
Sodium: 140 mmol/L (ref 135–145)
Sodium: 140 mmol/L (ref 135–145)
Sodium: 138 mmol/L (ref 135–145)
TCO2: 27 mmol/L (ref 22–32)
TCO2: 25 mmol/L (ref 22–32)
TCO2: 26 mmol/L (ref 22–32)
TCO2: 25 mmol/L (ref 22–32)
TCO2: 25 mmol/L (ref 22–32)
TCO2: 22 mmol/L (ref 22–32)

## 2018-01-06 LAB — CBC
HCT: 42.7 % (ref 39.0–52.0)
HCT: 32.6 % — ABNORMAL LOW (ref 39.0–52.0)
HCT: 31.9 % — ABNORMAL LOW (ref 39.0–52.0)
Hemoglobin: 15.1 g/dL (ref 13.0–17.0)
Hemoglobin: 11.4 g/dL — ABNORMAL LOW (ref 13.0–17.0)
Hemoglobin: 11.2 g/dL — ABNORMAL LOW (ref 13.0–17.0)
MCH: 33.3 pg (ref 26.0–34.0)
MCH: 32.3 pg (ref 26.0–34.0)
MCH: 32.3 pg (ref 26.0–34.0)
MCHC: 35.4 g/dL (ref 30.0–36.0)
MCHC: 35 g/dL (ref 30.0–36.0)
MCHC: 35.1 g/dL (ref 30.0–36.0)
MCV: 94.3 fL (ref 78.0–100.0)
MCV: 92.4 fL (ref 78.0–100.0)
MCV: 91.9 fL (ref 78.0–100.0)
PLATELETS: 259 10*3/uL (ref 150–400)
PLATELETS: 128 10*3/uL — AB (ref 150–400)
Platelets: 153 10*3/uL (ref 150–400)
RBC: 4.53 MIL/uL (ref 4.22–5.81)
RBC: 3.53 MIL/uL — AB (ref 4.22–5.81)
RBC: 3.47 MIL/uL — ABNORMAL LOW (ref 4.22–5.81)
RDW: 13.2 % (ref 11.5–15.5)
RDW: 12.7 % (ref 11.5–15.5)
RDW: 12.6 % (ref 11.5–15.5)
WBC: 7.2 10*3/uL (ref 4.0–10.5)
WBC: 9.5 10*3/uL (ref 4.0–10.5)
WBC: 14.3 10*3/uL — ABNORMAL HIGH (ref 4.0–10.5)

## 2018-01-06 LAB — GLUCOSE, CAPILLARY
GLUCOSE-CAPILLARY: 98 mg/dL (ref 65–99)
GLUCOSE-CAPILLARY: 127 mg/dL — AB (ref 65–99)
GLUCOSE-CAPILLARY: 121 mg/dL — AB (ref 65–99)
GLUCOSE-CAPILLARY: 128 mg/dL — AB (ref 65–99)
GLUCOSE-CAPILLARY: 132 mg/dL — AB (ref 65–99)
Glucose-Capillary: 104 mg/dL — ABNORMAL HIGH (ref 65–99)
Glucose-Capillary: 104 mg/dL — ABNORMAL HIGH (ref 65–99)

## 2018-01-06 LAB — APTT: aPTT: 28 seconds (ref 24–36)

## 2018-01-06 LAB — HEMOGLOBIN AND HEMATOCRIT, BLOOD
HCT: 28.7 % — ABNORMAL LOW (ref 39.0–52.0)
Hemoglobin: 10.2 g/dL — ABNORMAL LOW (ref 13.0–17.0)

## 2018-01-06 LAB — PROTIME-INR
INR: 1.31
PROTHROMBIN TIME: 16.1 s — AB (ref 11.4–15.2)

## 2018-01-06 LAB — BASIC METABOLIC PANEL
Anion gap: 11 (ref 5–15)
BUN: 7 mg/dL (ref 6–20)
CO2: 26 mmol/L (ref 22–32)
CREATININE: 1.16 mg/dL (ref 0.61–1.24)
Calcium: 9.2 mg/dL (ref 8.9–10.3)
Chloride: 102 mmol/L (ref 101–111)
GFR calc Af Amer: >60 mL/min (ref >60)
GLUCOSE: 133 mg/dL — AB (ref 65–99)
Potassium: 3.8 mmol/L (ref 3.5–5.1)
Sodium: 139 mmol/L (ref 135–145)

## 2018-01-06 LAB — POCT I-STAT 3, VENOUS BLOOD GAS (G3P V)
ACID-BASE DEFICIT: 1 mmol/L (ref 0.0–2.0)
Bicarbonate: 24.6 mmol/L (ref 20.0–28.0)
O2 SAT: 70 %
PCO2 VEN: 42.2 mmHg — AB (ref 44.0–60.0)
PO2 VEN: 38 mmHg (ref 32.0–45.0)
TCO2: 26 mmol/L (ref 22–32)
pH, Ven: 7.374 (ref 7.250–7.430)

## 2018-01-06 LAB — CREATININE, SERUM
Creatinine, Ser: 1.04 mg/dL (ref 0.61–1.24)
GFR calc Af Amer: >60 mL/min (ref >60)
GFR calc non Af Amer: >60 mL/min (ref >60)

## 2018-01-06 LAB — MAGNESIUM: Magnesium: 2.7 mg/dL — ABNORMAL HIGH (ref 1.7–2.4)

## 2018-01-06 LAB — POCT I-STAT 4, (NA,K, GLUC, HGB,HCT)
Glucose, Bld: 121 mg/dL — ABNORMAL HIGH (ref 65–99)
HCT: 32 % — ABNORMAL LOW (ref 39.0–52.0)
Hemoglobin: 10.9 g/dL — ABNORMAL LOW (ref 13.0–17.0)
Potassium: 3.8 mmol/L (ref 3.5–5.1)
SODIUM: 141 mmol/L (ref 135–145)

## 2018-01-06 LAB — HEPARIN LEVEL (UNFRACTIONATED): Heparin Unfractionated: 0.38 IU/mL (ref 0.30–0.70)

## 2018-01-06 LAB — PLATELET COUNT: Platelets: 175 10*3/uL (ref 150–400)

## 2018-01-06 SURGERY — CORONARY ARTERY BYPASS GRAFTING (CABG)
Anesthesia: General | Site: Chest

## 2018-01-06 MED ORDER — SODIUM CHLORIDE 0.45 % IV SOLN
INTRAVENOUS | Status: DC | PRN
  Administered 2018-01-06: 13:00:00 via INTRAVENOUS

## 2018-01-06 MED ORDER — EPHEDRINE 5 MG/ML INJ
INTRAVENOUS | Status: AC
  Filled 2018-01-06: qty 10

## 2018-01-06 MED ORDER — METOPROLOL TARTRATE 25 MG/10 ML ORAL SUSPENSION: 12.5000 mg | Freq: Two times a day (BID) | ORAL | Status: DC

## 2018-01-06 MED ORDER — DEXAMETHASONE SODIUM PHOSPHATE 10 MG/ML IJ SOLN
INTRAMUSCULAR | Status: AC
  Filled 2018-01-06: qty 1

## 2018-01-06 MED ORDER — FENTANYL CITRATE (PF) 250 MCG/5ML IJ SOLN
INTRAMUSCULAR | Status: AC
  Filled 2018-01-06: qty 5

## 2018-01-06 MED ORDER — ARTIFICIAL TEARS OPHTHALMIC OINT
TOPICAL_OINTMENT | OPHTHALMIC | Status: DC | PRN
  Administered 2018-01-06: 1 via OPHTHALMIC

## 2018-01-06 MED ORDER — MIDAZOLAM HCL 2 MG/2ML IJ SOLN: 2.0000 mg | INTRAMUSCULAR | Status: DC | PRN

## 2018-01-06 MED ORDER — SODIUM CHLORIDE 0.9 % IV SOLN
1.5000 g | Freq: Two times a day (BID) | INTRAVENOUS | Status: AC
  Administered 2018-01-06 – 2018-01-08 (×4): 1.5 g via INTRAVENOUS
  Filled 2018-01-06 (×4): qty 1.5

## 2018-01-06 MED ORDER — SODIUM CHLORIDE 0.9 % IV SOLN
250.0000 mL | INTRAVENOUS | Status: DC
  Administered 2018-01-07: 250 mL via INTRAVENOUS

## 2018-01-06 MED ORDER — VECURONIUM BROMIDE 10 MG IV SOLR: INTRAVENOUS | Status: DC | PRN

## 2018-01-06 MED ORDER — SODIUM CHLORIDE 0.9 % IV SOLN: INTRAVENOUS | Status: DC

## 2018-01-06 MED ORDER — CHLORHEXIDINE GLUCONATE 0.12 % MT SOLN
15.0000 mL | OROMUCOSAL | Status: AC
  Administered 2018-01-06: 15 mL via OROMUCOSAL
  Filled 2018-01-06: qty 15

## 2018-01-06 MED ORDER — SODIUM CHLORIDE 0.9% FLUSH
3.0000 mL | Freq: Two times a day (BID) | INTRAVENOUS | Status: DC
  Administered 2018-01-07 – 2018-01-11 (×5): 3 mL via INTRAVENOUS

## 2018-01-06 MED ORDER — ARTIFICIAL TEARS OPHTHALMIC OINT
TOPICAL_OINTMENT | OPHTHALMIC | Status: AC
  Filled 2018-01-06: qty 3.5

## 2018-01-06 MED ORDER — LACTATED RINGERS IV SOLN
INTRAVENOUS | Status: DC | PRN
  Administered 2018-01-06 (×2): via INTRAVENOUS

## 2018-01-06 MED ORDER — ONDANSETRON HCL 4 MG/2ML IJ SOLN: 4.0000 mg | Freq: Four times a day (QID) | INTRAMUSCULAR | Status: DC | PRN

## 2018-01-06 MED ORDER — LACTATED RINGERS IV SOLN
INTRAVENOUS | Status: DC | PRN
  Administered 2018-01-06: 07:00:00 via INTRAVENOUS

## 2018-01-06 MED ORDER — BISACODYL 10 MG RE SUPP: 10.0000 mg | Freq: Every day | RECTAL | Status: DC

## 2018-01-06 MED ORDER — ONDANSETRON HCL 4 MG/2ML IJ SOLN
INTRAMUSCULAR | Status: AC
  Filled 2018-01-06: qty 2

## 2018-01-06 MED ORDER — ASPIRIN EC 325 MG PO TBEC
325.0000 mg | DELAYED_RELEASE_TABLET | Freq: Every day | ORAL | Status: DC
Start: 2018-01-07 — End: 2018-01-12
  Administered 2018-01-07 – 2018-01-12 (×6): 325 mg via ORAL
  Filled 2018-01-06 (×6): qty 1

## 2018-01-06 MED ORDER — POTASSIUM CHLORIDE 10 MEQ/50ML IV SOLN
10.0000 meq | INTRAVENOUS | Status: AC
  Administered 2018-01-06 (×3): 10 meq via INTRAVENOUS

## 2018-01-06 MED ORDER — SODIUM CHLORIDE 0.9% FLUSH: 3.0000 mL | INTRAVENOUS | Status: DC | PRN

## 2018-01-06 MED ORDER — BISACODYL 5 MG PO TBEC
10.0000 mg | DELAYED_RELEASE_TABLET | Freq: Every day | ORAL | Status: DC
  Administered 2018-01-07 – 2018-01-12 (×3): 10 mg via ORAL
  Filled 2018-01-06 (×3): qty 2

## 2018-01-06 MED ORDER — OXYCODONE HCL 5 MG PO TABS
5.0000 mg | ORAL_TABLET | ORAL | Status: DC | PRN
  Administered 2018-01-07 – 2018-01-12 (×12): 10 mg via ORAL
  Filled 2018-01-06 (×13): qty 2

## 2018-01-06 MED ORDER — CLEVIDIPINE BUTYRATE 0.5 MG/ML IV EMUL
INTRAVENOUS | Status: DC | PRN
  Administered 2018-01-06: 2 mg/h via INTRAVENOUS

## 2018-01-06 MED ORDER — SODIUM BICARBONATE 8.4 % IV SOLN
50.0000 meq | Freq: Once | INTRAVENOUS | Status: AC
  Administered 2018-01-06: 50 meq via INTRAVENOUS

## 2018-01-06 MED ORDER — VANCOMYCIN HCL IN DEXTROSE 1-5 GM/200ML-% IV SOLN
1000.0000 mg | Freq: Once | INTRAVENOUS | Status: AC
  Administered 2018-01-06: 1000 mg via INTRAVENOUS
  Filled 2018-01-06: qty 200

## 2018-01-06 MED ORDER — MAGNESIUM SULFATE 4 GM/100ML IV SOLN
4.0000 g | Freq: Once | INTRAVENOUS | Status: AC
  Administered 2018-01-06: 4 g via INTRAVENOUS
  Filled 2018-01-06: qty 100

## 2018-01-06 MED ORDER — LIDOCAINE 2% (20 MG/ML) 5 ML SYRINGE
INTRAMUSCULAR | Status: AC
  Filled 2018-01-06: qty 5

## 2018-01-06 MED ORDER — DEXMEDETOMIDINE HCL IN NACL 200 MCG/50ML IV SOLN
0.0000 ug/kg/h | INTRAVENOUS | Status: DC
  Administered 2018-01-06: 0.1 ug/kg/h via INTRAVENOUS
  Administered 2018-01-07: 0.3 ug/kg/h via INTRAVENOUS
  Filled 2018-01-06 (×2): qty 50

## 2018-01-06 MED ORDER — METOCLOPRAMIDE HCL 5 MG/ML IJ SOLN
10.0000 mg | Freq: Four times a day (QID) | INTRAMUSCULAR | Status: AC
  Administered 2018-01-06 – 2018-01-07 (×3): 10 mg via INTRAVENOUS
  Filled 2018-01-06 (×3): qty 2

## 2018-01-06 MED ORDER — FAMOTIDINE IN NACL 20-0.9 MG/50ML-% IV SOLN
20.0000 mg | Freq: Two times a day (BID) | INTRAVENOUS | Status: AC
  Administered 2018-01-06: 20 mg via INTRAVENOUS

## 2018-01-06 MED ORDER — VECURONIUM BROMIDE 10 MG IV SOLR
INTRAVENOUS | Status: AC
  Filled 2018-01-06: qty 20

## 2018-01-06 MED ORDER — HEPARIN SODIUM (PORCINE) 1000 UNIT/ML IJ SOLN
INTRAMUSCULAR | Status: DC | PRN
  Administered 2018-01-06: 33000 [IU] via INTRAVENOUS

## 2018-01-06 MED ORDER — HEMOSTATIC AGENTS (NO CHARGE) OPTIME
TOPICAL | Status: DC | PRN
  Administered 2018-01-06 (×2): 1 via TOPICAL

## 2018-01-06 MED ORDER — DOCUSATE SODIUM 100 MG PO CAPS
200.0000 mg | ORAL_CAPSULE | Freq: Every day | ORAL | Status: DC
  Administered 2018-01-07 – 2018-01-12 (×5): 200 mg via ORAL
  Filled 2018-01-06 (×6): qty 2

## 2018-01-06 MED ORDER — SODIUM CHLORIDE 0.9 % IV SOLN
0.0000 ug/min | INTRAVENOUS | Status: DC
  Filled 2018-01-06: qty 2

## 2018-01-06 MED ORDER — LACTATED RINGERS IV SOLN: INTRAVENOUS | Status: DC

## 2018-01-06 MED ORDER — ASPIRIN 81 MG PO CHEW: 324.0000 mg | CHEWABLE_TABLET | Freq: Every day | ORAL | Status: DC

## 2018-01-06 MED ORDER — NITROPRUSSIDE SODIUM 25 MG/ML IV SOLN
0.0000 ug/kg/min | INTRAVENOUS | Status: DC
  Filled 2018-01-06: qty 2

## 2018-01-06 MED ORDER — EPHEDRINE SULFATE-NACL 50-0.9 MG/10ML-% IV SOSY
PREFILLED_SYRINGE | INTRAVENOUS | Status: DC | PRN
  Administered 2018-01-06 (×2): 5 mg via INTRAVENOUS

## 2018-01-06 MED ORDER — LIDOCAINE 2% (20 MG/ML) 5 ML SYRINGE
INTRAMUSCULAR | Status: DC | PRN
  Administered 2018-01-06: 80 mg via INTRAVENOUS

## 2018-01-06 MED ORDER — VECURONIUM BROMIDE 10 MG IV SOLR
INTRAVENOUS | Status: DC | PRN
  Administered 2018-01-06 (×4): 10 mg via INTRAVENOUS

## 2018-01-06 MED ORDER — ACETAMINOPHEN 650 MG RE SUPP
650.0000 mg | Freq: Once | RECTAL | Status: AC
  Administered 2018-01-06: 650 mg via RECTAL

## 2018-01-06 MED ORDER — NITROGLYCERIN IN D5W 200-5 MCG/ML-% IV SOLN: 0.0000 ug/min | INTRAVENOUS | Status: DC

## 2018-01-06 MED ORDER — KETOROLAC TROMETHAMINE 15 MG/ML IJ SOLN
15.0000 mg | Freq: Four times a day (QID) | INTRAMUSCULAR | Status: DC
  Administered 2018-01-06 – 2018-01-07 (×3): 15 mg via INTRAVENOUS
  Filled 2018-01-06 (×3): qty 1

## 2018-01-06 MED ORDER — TRAMADOL HCL 50 MG PO TABS
50.0000 mg | ORAL_TABLET | ORAL | Status: DC | PRN
  Administered 2018-01-07 (×2): 100 mg via ORAL
  Filled 2018-01-06 (×2): qty 2

## 2018-01-06 MED ORDER — MORPHINE SULFATE (PF) 2 MG/ML IV SOLN
2.0000 mg | INTRAVENOUS | Status: DC | PRN
Start: 2018-01-06 — End: 2018-01-12
  Administered 2018-01-07: 2 mg via INTRAVENOUS
  Filled 2018-01-06: qty 1

## 2018-01-06 MED ORDER — PAPAVERINE HCL 30 MG/ML IJ SOLN
INTRAMUSCULAR | Status: DC | PRN
  Administered 2018-01-06: 500 mL via INTRAVASCULAR

## 2018-01-06 MED ORDER — MORPHINE SULFATE (PF) 2 MG/ML IV SOLN
1.0000 mg | INTRAVENOUS | Status: AC | PRN
  Administered 2018-01-06: 4 mg via INTRAVENOUS
  Administered 2018-01-06: 2 mg via INTRAVENOUS
  Administered 2018-01-06: 4 mg via INTRAVENOUS
  Filled 2018-01-06 (×3): qty 2

## 2018-01-06 MED ORDER — VECURONIUM BROMIDE 10 MG IV SOLR
INTRAVENOUS | Status: AC
  Filled 2018-01-06: qty 10

## 2018-01-06 MED ORDER — FENTANYL CITRATE (PF) 250 MCG/5ML IJ SOLN
INTRAMUSCULAR | Status: DC | PRN
  Administered 2018-01-06: 100 ug via INTRAVENOUS
  Administered 2018-01-06: 50 ug via INTRAVENOUS
  Administered 2018-01-06: 100 ug via INTRAVENOUS
  Administered 2018-01-06: 200 ug via INTRAVENOUS
  Administered 2018-01-06: 100 ug via INTRAVENOUS
  Administered 2018-01-06: 200 ug via INTRAVENOUS
  Administered 2018-01-06: 50 ug via INTRAVENOUS
  Administered 2018-01-06: 100 ug via INTRAVENOUS
  Administered 2018-01-06 (×3): 150 ug via INTRAVENOUS

## 2018-01-06 MED ORDER — ALBUMIN HUMAN 5 % IV SOLN
250.0000 mL | INTRAVENOUS | Status: AC | PRN
  Administered 2018-01-06 (×2): 250 mL via INTRAVENOUS

## 2018-01-06 MED ORDER — ACETAMINOPHEN 160 MG/5ML PO SOLN: 650.0000 mg | Freq: Once | ORAL | Status: AC

## 2018-01-06 MED ORDER — PHENYLEPHRINE 40 MCG/ML (10ML) SYRINGE FOR IV PUSH (FOR BLOOD PRESSURE SUPPORT)
PREFILLED_SYRINGE | INTRAVENOUS | Status: DC | PRN
  Administered 2018-01-06: 120 ug via INTRAVENOUS
  Administered 2018-01-06: 80 ug via INTRAVENOUS
  Administered 2018-01-06: 160 ug via INTRAVENOUS
  Administered 2018-01-06: 40 ug via INTRAVENOUS
  Administered 2018-01-06: 80 ug via INTRAVENOUS
  Administered 2018-01-06: 40 ug via INTRAVENOUS
  Administered 2018-01-06: 80 ug via INTRAVENOUS

## 2018-01-06 MED ORDER — ACETAMINOPHEN 500 MG PO TABS
1000.0000 mg | ORAL_TABLET | Freq: Four times a day (QID) | ORAL | Status: AC
  Administered 2018-01-07 – 2018-01-11 (×15): 1000 mg via ORAL
  Filled 2018-01-06 (×16): qty 2

## 2018-01-06 MED ORDER — PROPOFOL 10 MG/ML IV BOLUS
INTRAVENOUS | Status: AC
  Filled 2018-01-06: qty 20

## 2018-01-06 MED ORDER — PROTAMINE SULFATE 10 MG/ML IV SOLN
INTRAVENOUS | Status: DC | PRN
  Administered 2018-01-06 (×2): 30 mg via INTRAVENOUS
  Administered 2018-01-06: 20 mg via INTRAVENOUS
  Administered 2018-01-06: 60 mg via INTRAVENOUS
  Administered 2018-01-06 (×2): 70 mg via INTRAVENOUS
  Administered 2018-01-06: 50 mg via INTRAVENOUS

## 2018-01-06 MED ORDER — PROPOFOL 10 MG/ML IV BOLUS
INTRAVENOUS | Status: DC | PRN
  Administered 2018-01-06: 120 mg via INTRAVENOUS

## 2018-01-06 MED ORDER — PHENYLEPHRINE 40 MCG/ML (10ML) SYRINGE FOR IV PUSH (FOR BLOOD PRESSURE SUPPORT)
PREFILLED_SYRINGE | INTRAVENOUS | Status: AC
  Filled 2018-01-06: qty 10

## 2018-01-06 MED ORDER — METOPROLOL TARTRATE 5 MG/5ML IV SOLN
2.5000 mg | INTRAVENOUS | Status: DC | PRN
  Administered 2018-01-07: 2.5 mg via INTRAVENOUS
  Filled 2018-01-06: qty 5

## 2018-01-06 MED ORDER — MIDAZOLAM HCL 10 MG/2ML IJ SOLN
INTRAMUSCULAR | Status: AC
  Filled 2018-01-06: qty 2

## 2018-01-06 MED ORDER — ACETAMINOPHEN 160 MG/5ML PO SOLN: 1000.0000 mg | Freq: Four times a day (QID) | ORAL | Status: AC

## 2018-01-06 MED ORDER — 0.9 % SODIUM CHLORIDE (POUR BTL) OPTIME
TOPICAL | Status: DC | PRN
  Administered 2018-01-06: 6000 mL

## 2018-01-06 MED ORDER — LACTATED RINGERS IV SOLN: 500.0000 mL | Freq: Once | INTRAVENOUS | Status: DC | PRN

## 2018-01-06 MED ORDER — METOPROLOL TARTRATE 12.5 MG HALF TABLET
12.5000 mg | ORAL_TABLET | Freq: Two times a day (BID) | ORAL | Status: DC
  Administered 2018-01-07 – 2018-01-09 (×5): 12.5 mg via ORAL
  Filled 2018-01-06 (×5): qty 1

## 2018-01-06 MED ORDER — FENTANYL CITRATE (PF) 250 MCG/5ML IJ SOLN
INTRAMUSCULAR | Status: AC
Start: 2018-01-06 — End: 2018-01-06
  Filled 2018-01-06: qty 5

## 2018-01-06 MED ORDER — MIDAZOLAM HCL 5 MG/5ML IJ SOLN
INTRAMUSCULAR | Status: DC | PRN
  Administered 2018-01-06 (×2): 2 mg via INTRAVENOUS
  Administered 2018-01-06: 4 mg via INTRAVENOUS
  Administered 2018-01-06: 2 mg via INTRAVENOUS

## 2018-01-06 MED ORDER — SODIUM CHLORIDE 0.9 % IJ SOLN
OROMUCOSAL | Status: DC | PRN
  Administered 2018-01-06 (×3): 4 mL via TOPICAL

## 2018-01-06 MED ORDER — INSULIN REGULAR BOLUS VIA INFUSION
0.0000 [IU] | Freq: Three times a day (TID) | INTRAVENOUS | Status: DC
  Filled 2018-01-06: qty 10

## 2018-01-06 MED ORDER — PANTOPRAZOLE SODIUM 40 MG PO TBEC
40.0000 mg | DELAYED_RELEASE_TABLET | Freq: Every day | ORAL | Status: DC
  Administered 2018-01-08 – 2018-01-12 (×5): 40 mg via ORAL
  Filled 2018-01-06 (×5): qty 1

## 2018-01-06 MED ORDER — DEXTROSE 5 % IV SOLN
INTRAVENOUS | Status: DC | PRN
  Administered 2018-01-06: 30 ug/min via INTRAVENOUS

## 2018-01-06 MED FILL — Heparin Sod (Porcine)-NaCl IV Soln 1000 Unit/500ML-0.9%: INTRAVENOUS | Qty: 1000 | Status: AC

## 2018-01-06 SURGICAL SUPPLY — 75 items
ADAPTER CARDIO PERF ANTE/RETRO (ADAPTER) ×2 IMPLANT
ADPR PRFSN 84XANTGRD RTRGD (ADAPTER) ×2
BAG DECANTER FOR FLEXI CONT (MISCELLANEOUS) ×4 IMPLANT
BANDAGE ACE 4X5 VEL STRL LF (GAUZE/BANDAGES/DRESSINGS) ×4 IMPLANT
BANDAGE ACE 6X5 VEL STRL LF (GAUZE/BANDAGES/DRESSINGS) ×4 IMPLANT
BLADE NDL 3 SS STRL (BLADE) IMPLANT
BLADE NEEDLE 3 SS STRL (BLADE) ×3 IMPLANT
BLADE NEEDLE 3MM SS STRL (BLADE) ×1
BLADE STERNUM SYSTEM 6 (BLADE) ×4 IMPLANT
BLADE SURG ROTATE 9660 (MISCELLANEOUS) ×2 IMPLANT
BNDG GAUZE ELAST 4 BULKY (GAUZE/BANDAGES/DRESSINGS) ×4 IMPLANT
CANISTER SUCT 3000ML PPV (MISCELLANEOUS) ×4 IMPLANT
CANNULA GUNDRY RCSP 15FR (MISCELLANEOUS) ×2 IMPLANT
CATH CPB KIT GERHARDT (MISCELLANEOUS) ×4 IMPLANT
CATH THORACIC 28FR (CATHETERS) ×4 IMPLANT
CRADLE DONUT ADULT HEAD (MISCELLANEOUS) ×4 IMPLANT
DRAIN CHANNEL 28F RND 3/8 FF (WOUND CARE) ×4 IMPLANT
DRAPE CARDIOVASCULAR INCISE (DRAPES) ×4
DRAPE SLUSH/WARMER DISC (DRAPES) ×4 IMPLANT
DRAPE SRG 135X102X78XABS (DRAPES) ×2 IMPLANT
DRSG AQUACEL AG ADV 3.5X14 (GAUZE/BANDAGES/DRESSINGS) ×4 IMPLANT
ELECT BLADE 4.0 EZ CLEAN MEGAD (MISCELLANEOUS) ×4
ELECT REM PT RETURN 9FT ADLT (ELECTROSURGICAL) ×8
ELECTRODE BLDE 4.0 EZ CLN MEGD (MISCELLANEOUS) ×2 IMPLANT
ELECTRODE REM PT RTRN 9FT ADLT (ELECTROSURGICAL) ×4 IMPLANT
FELT TEFLON 1X6 (MISCELLANEOUS) ×8 IMPLANT
GAUZE SPONGE 4X4 12PLY STRL (GAUZE/BANDAGES/DRESSINGS) ×6 IMPLANT
GAUZE SPONGE 4X4 12PLY STRL LF (GAUZE/BANDAGES/DRESSINGS) ×2 IMPLANT
GLOVE BIO SURGEON STRL SZ 6.5 (GLOVE) ×14 IMPLANT
GLOVE BIO SURGEONS STRL SZ 6.5 (GLOVE) ×8
GLOVE BIOGEL PI IND STRL 6 (GLOVE) IMPLANT
GLOVE BIOGEL PI INDICATOR 6 (GLOVE) ×8
GOWN STRL REUS W/ TWL LRG LVL3 (GOWN DISPOSABLE) ×8 IMPLANT
GOWN STRL REUS W/TWL LRG LVL3 (GOWN DISPOSABLE) ×32
HEMOSTAT POWDER SURGIFOAM 1G (HEMOSTASIS) ×12 IMPLANT
HEMOSTAT SURGICEL 2X14 (HEMOSTASIS) ×4 IMPLANT
KIT BASIN OR (CUSTOM PROCEDURE TRAY) ×4 IMPLANT
KIT CATH SUCT 8FR (CATHETERS) ×4 IMPLANT
KIT SUCTION CATH 14FR (SUCTIONS) ×10 IMPLANT
KIT TURNOVER KIT B (KITS) ×4 IMPLANT
KIT VASOVIEW HEMOPRO VH 3000 (KITS) ×4 IMPLANT
LEAD PACING MYOCARDI (MISCELLANEOUS) ×4 IMPLANT
MARKER GRAFT CORONARY BYPASS (MISCELLANEOUS) ×12 IMPLANT
NS IRRIG 1000ML POUR BTL (IV SOLUTION) ×20 IMPLANT
PACK E OPEN HEART (SUTURE) ×4 IMPLANT
PACK OPEN HEART (CUSTOM PROCEDURE TRAY) ×4 IMPLANT
PAD ARMBOARD 7.5X6 YLW CONV (MISCELLANEOUS) ×8 IMPLANT
PAD CARDIAC INSULATION (MISCELLANEOUS) ×2 IMPLANT
PAD ELECT DEFIB RADIOL ZOLL (MISCELLANEOUS) ×4 IMPLANT
PENCIL BUTTON HOLSTER BLD 10FT (ELECTRODE) ×4 IMPLANT
PUNCH AORTIC ROTATE  4.5MM 8IN (MISCELLANEOUS) ×2 IMPLANT
SET CARDIOPLEGIA MPS 5001102 (MISCELLANEOUS) ×2 IMPLANT
SOLUTION ANTI FOG 6CC (MISCELLANEOUS) ×2 IMPLANT
SPONGE LAP 18X18 X RAY DECT (DISPOSABLE) ×4 IMPLANT
SUT BONE WAX W31G (SUTURE) ×4 IMPLANT
SUT MNCRL AB 4-0 PS2 18 (SUTURE) ×2 IMPLANT
SUT PROLENE 3 0 SH1 36 (SUTURE) ×4 IMPLANT
SUT PROLENE 4 0 TF (SUTURE) ×8 IMPLANT
SUT PROLENE 6 0 CC (SUTURE) ×12 IMPLANT
SUT PROLENE 7 0 BV1 MDA (SUTURE) ×4 IMPLANT
SUT PROLENE 8 0 BV175 6 (SUTURE) ×2 IMPLANT
SUT STEEL 6MS V (SUTURE) ×6 IMPLANT
SUT STEEL SZ 6 DBL 3X14 BALL (SUTURE) ×4 IMPLANT
SUT VIC AB 1 CTX 18 (SUTURE) ×8 IMPLANT
SUT VIC AB 2-0 CT1 27 (SUTURE) ×4
SUT VIC AB 2-0 CT1 TAPERPNT 27 (SUTURE) IMPLANT
SYSTEM SAHARA CHEST DRAIN ATS (WOUND CARE) ×4 IMPLANT
TAPE CLOTH SURG 4X10 WHT LF (GAUZE/BANDAGES/DRESSINGS) ×2 IMPLANT
TAPE PAPER 2X10 WHT MICROPORE (GAUZE/BANDAGES/DRESSINGS) ×2 IMPLANT
TOWEL GREEN STERILE (TOWEL DISPOSABLE) ×4 IMPLANT
TOWEL GREEN STERILE FF (TOWEL DISPOSABLE) ×4 IMPLANT
TRAY FOLEY SILVER 16FR TEMP (SET/KITS/TRAYS/PACK) ×4 IMPLANT
TUBING INSUFFLATION (TUBING) ×4 IMPLANT
UNDERPAD 30X30 (UNDERPADS AND DIAPERS) ×4 IMPLANT
WATER STERILE IRR 1000ML POUR (IV SOLUTION) ×8 IMPLANT

## 2018-01-06 NOTE — Procedures (Signed)
Extubation Procedure Note  Patient Details:   Name: DHANI DANNEMILLER DOB: 1956/03/08 MRN: [id number]   Airway Documentation:    Vent end date: 01/06/18 Vent end time: 1632   Evaluation  O2 sats: stable throughout Complications: No apparent complications Patient did tolerate procedure well. Bilateral Breath Sounds: Diminished   Yes   Pt extubated to 4L N/C.  No stridor noted.  NIF -21, VC .  RN @ bedside.  Christophe Louis 01/06/2018, 4:37 PM

## 2018-01-06 NOTE — Progress Notes (Signed)
      [address]       [place] [address]             [phone]      S/p CABG x 3  BP (!) 85/55 (BP Location: Right Arm)   Pulse 69   Temp 97.9 F (36.6 C) (Core)   Resp 15   Ht  (1.803 m)   Wt 202 lb 9.6 oz (91.9 kg)   SpO2 97%   BMI 28.26 kg/m  CI= 2.8  Intake/Output Summary (Last 24 hours) at 01/06/2018 1844 Last data filed at 01/06/2018 1800 Gross per 24 hour  Intake 6717.02 ml  Output 4920 ml  Net 1797.02 ml   K= 4.4, creatinine 0.9 Hct 31  Doing well early postop  Viviann Spare C. Dorris Fetch, MD [facility] [phone]

## 2018-01-06 NOTE — Op Note (Signed)
NAME: Cameron Camacho, Cameron Camacho MEDICAL RECORD [id number] ACCOUNT [id number] DATE OF BIRTH:Oct 10, 1955 FACILITY: [facility] LOCATION: [facility] PHYSICIAN:Othon Guardia Bari Daziyah Cogan, MD  OPERATIVE REPORT  DATE OF OPERATION:  01/06/2018  PREOPERATIVE DIAGNOSIS:  Unstable angina with critical left main obstruction.  POSTOPERATIVE DIAGNOSES:  Unstable angina with critical left main obstruction.  SURGICAL PROCEDURE:  Coronary artery bypass grafting x3 with the left internal mammary to the left anterior descending coronary artery, reverse saphenous vein graft to the circumflex coronary artery, reverse saphenous vein graft to the distal right  coronary artery with right thigh greater saphenous endoscopic vein harvesting.  SURGEON:  Sheliah Plane, MD  FIRST ASSISTANT:  Lowella Dandy, PA  BRIEF HISTORY:  The patient is a 62 year old male with strong family history of coronary artery disease who presented on 04/27 to the cardiology service with new onset of chest pain.  He was stabilized medically and underwent elective cardiac  catheterization the afternoon prior to surgery.  At the time of catheterization, he was found to have diffuse disease in the right coronary system with small posterior descending and posterolateral branches of the right coronary artery and a greater than  90% stenosis of the distal left main involving the LAD.  Overall, ventricular function was preserved.  Coronary artery bypass grafting was initially recommended to the patient by Cardiology.  Initially, he was very reluctant, but after some discussion  about the critical nature of his anatomy, he was agreeable to proceed with coronary artery bypass grafting.  Risks and options were discussed with him in detail.  DESCRIPTION OF PROCEDURE:  With Swan-Ganz and arterial line monitoring placed, the patient underwent general endotracheal anesthesia without incident.  The skin on the chest and legs was prepped with Betadine and draped in the usual  sterile manner.   Appropriate timeout was performed.  We then proceeded with endoscopic vein harvesting from the right thigh.  Consideration for radial artery harvest, though the patient was left handed and had been cath'd through the right radial.  The vein was of  adequate quality and caliber.  Median sternotomy was performed.  The left internal mammary artery was dissected down to its pedicle graft.  The distal artery was divided and had good free flow.  The pericardium was opened.  Overall, ventricular function  appeared preserved.  The patient was systemically heparinized.  The ascending aorta was cannulated.  The right atrium was cannulated.  A retrograde cardioplegia catheter was placed into  the coronary sinus.  The patient was then placed on cardiopulmonary bypass 2.4 L per minute/m2.  The sites of anastomoses were dissected out of the epicardium.  The posterior descending and posterolateral branches of the right coronary artery were very  small vessels, less than 1 mm, and diffusely diseased, not bypassable.  The distal right coronary artery was thickened.  The left anterior descending coronary artery was an adequate site for bypass in the midportion.  A moderate-sized circumflex branch  was also identified.  The patient's body temperature was cooled to 32 degrees.  The aortic crossclamp was applied, and 500 mL of cold blood potassium cardioplegia was administered with diastolic arrest of the heart.  Myocardial septal temperature was  monitored throughout the crossclamp.  Attention was turned first to the distal right coronary artery.  It should be noted that the posterior descending and posterolateral branches were not large enough for bypass.  The distal right coronary artery was  thickened and diffusely diseased.  We were able to open the vessel, and a 1 mm  probe passed distally.  Using a running 7-0 Prolene, a distal anastomosis was performed with a segment of reverse saphenous vein graft.   Attention was then turned to the  circumflex coronary artery, which was opened and made a 1.5 mm probe distally using a running 7-0 Prolene.  A distal anastomosis was performed.  Additional cold blood cardioplegia was administered down the vein grafts.  We then turned our attention to  the left anterior descending coronary artery.  The midportion of the LAD was opened, and a 1.5 mm probe passed distally.  Using a running 8-0 Prolene, the left internal mammary artery was anastomosed to the left anterior descending coronary artery.  With  release of the bulldog and the mammary artery, there was rise in myocardial septal temperature.  The bulldog was placed back on the mammary artery.  With the cross clamp still in place, 2 punch aortotomies were performed, and each of the 2 vein grafts  were anastomosed to the ascending aorta.  The bulldog was removed from the mammary artery with prompt rise in myocardial septal temperature.  The heart was allowed to passively fill and deair.  The proximal anastomoses were completed, and the aortic  crossclamp was removed with a total crossclamp time of 73 minutes.  The sites of anastomosis were inspected and were free of bleeding.  The retrograde cardioplegia catheter was removed.  The patient's body temperature rewarmed to 37 degrees.  He was then  ventilated and weaned from cardiopulmonary bypass without pressors.  Overall, ventricular function appeared preserved.  He remained hemodynamically stable.  He was decannulated in the usual fashion.  Protamine sulfate was administered.  With the  operative field hemostatic, a left pleural tube and a Blake mediastinal drain were left in place.  The pericardium was loosely reapproximated.  The sternum was closed with #6 stainless steel wire.  The fascia was closed with interrupted 0 Vicryl, running  3-0 Vicryl in the subcutaneous tissue, and 4-0 subcuticular stitch and skin edges.  Dry dressings were applied.  Sponge and needle  count was reported as correct at completion of the procedure.  The patient tolerated the procedure without obvious  complication and was transferred to the surgical intensive care unit for further postoperative care.  Total bypass time was 108 minutes.  He did not require any blood bank blood products during the operative procedure.  LN/NUANCE  D:01/06/2018 T:01/06/2018 JOB:[id number]

## 2018-01-06 NOTE — Anesthesia Postprocedure Evaluation (Signed)
Anesthesia Post Note  Patient: ARAGON SCARANTINO  Procedure(s) Performed: CORONARY ARTERY BYPASS GRAFTING (CABG) x three, using left internal mammary artery and right leg greater saphenous vein harvested endoscopically (N/A Chest) TRANSESOPHAGEAL ECHOCARDIOGRAM (TEE) (N/A )     Patient location during evaluation: PACU Anesthesia Type: General Level of consciousness: sedated Pain management: pain level controlled Vital Signs Assessment: post-procedure vital signs reviewed and stable Respiratory status: patient remains intubated per anesthesia plan Cardiovascular status: blood pressure returned to baseline and stable Postop Assessment: no apparent nausea or vomiting Anesthetic complications: yes Anesthetic complication details: anesthesia complicationsComments: Tooth #2 loosened by SRNA on induction when scissoring mouth open.  Tooth remained in place during surgery, but noted to be in oropharynx at the end of the case.  Tooth was collected and put in specimen cup.  Patient with known poor dentition in pre-op.  Safety zone portal incident filed by CRNA.    Last Vitals:  Vitals:   01/06/18 1548 01/06/18 1600  BP:  119/64  Pulse: 70 70  Resp: 13 (!) 22  Temp: (!) 36.1 C (!) 36.2 C  SpO2: 100% 100%    Last Pain:  Vitals:   01/06/18 1600  TempSrc: Core (Comment)  PainSc:                  Cecile Hearing

## 2018-01-06 NOTE — OR Nursing (Signed)
12:00 - 45 minute call to SICU charge nurse 12:40 - 20 minute call to SICU charge nurse

## 2018-01-06 NOTE — Anesthesia Procedure Notes (Signed)
Central Venous Catheter Insertion Performed by: Heather Roberts, MD, anesthesiologist Start/End4/30/2019 6:41 AM, 01/06/2018 6:51 AM Patient location: Pre-op. Preanesthetic checklist: patient identified, IV checked, site marked, risks and benefits discussed, surgical consent, monitors and equipment checked, pre-op evaluation, timeout performed and anesthesia consent Position: Trendelenburg Lidocaine 1% used for infiltration and patient sedated Hand hygiene performed , maximum sterile barriers used  and Seldinger technique used Catheter size: 8.5 Fr Total catheter length 8. PA cath was placed.Sheath introducer Swan type:thermodilution PA Cath depth:50 Procedure performed using ultrasound guided technique. Ultrasound Notes:anatomy identified, needle tip was noted to be adjacent to the nerve/plexus identified, no ultrasound evidence of intravascular and/or intraneural injection and image(s) printed for medical record Attempts: 1 Following insertion, line sutured and dressing applied. Post procedure assessment: free fluid flow, blood return through all ports and no air  Patient tolerated the procedure well with no immediate complications.

## 2018-01-06 NOTE — Anesthesia Procedure Notes (Addendum)
Procedure Name: Intubation Date/Time: 01/06/2018 7:49 AM Performed by: Orlie Dakin, CRNA Pre-anesthesia Checklist: Patient identified, Emergency Drugs available, Suction available, Patient being monitored and Timeout performed Patient Re-evaluated:Patient Re-evaluated prior to induction Oxygen Delivery Method: Circle system utilized Preoxygenation: Pre-oxygenation with 100% oxygen Induction Type: IV induction Ventilation: Mask ventilation without difficulty Laryngoscope Size: Mac and 4 Grade View: Grade I Tube type: Oral Tube size: 8.0 mm Number of attempts: 1 Airway Equipment and Method: Stylet Placement Confirmation: ETT inserted through vocal cords under direct vision,  positive ETCO2,  CO2 detector and breath sounds checked- equal and bilateral Secured at: 24 cm Tube secured with: Tape Dental Injury: Dental damage  Comments: Partial removed preop. Upon mouth opening for DVL, Right upper molar noticed to be cracked. SRNA scissor mouth open, right upper molar cracked with scissoring prior to DVL; immediately inspected for remnants but none found. Continued with DVL/ intubation, no remnants visualized in or around glottic opening or in trachea upon DVL. No immediate complications at this time.

## 2018-01-06 NOTE — Transfer of Care (Signed)
Immediate Anesthesia Transfer of Care Note  Patient: Cameron Camacho  Procedure(s) Performed: CORONARY ARTERY BYPASS GRAFTING (CABG) x three, using left internal mammary artery and right leg greater saphenous vein harvested endoscopically (N/A Chest) TRANSESOPHAGEAL ECHOCARDIOGRAM (TEE) (N/A )  Patient Location: ICU  Anesthesia Type:General  Level of Consciousness: sedated and Patient remains intubated per anesthesia plan  Airway & Oxygen Therapy: Patient remains intubated per anesthesia plan and Patient placed on Ventilator (see vital sign flow sheet for setting)  Post-op Assessment: Post -op Vital signs reviewed and stable  Post vital signs: Reviewed and stable  Last Vitals:  Vitals Value Taken Time  BP 126/49 01/06/2018  1:19 PM  Temp 35.7 C 01/06/2018  1:21 PM  Pulse 86 01/06/2018  1:21 PM  Resp 12 01/06/2018  1:21 PM  SpO2 100 % 01/06/2018  1:21 PM  Vitals shown include unvalidated device data.  Last Pain:  Vitals:   01/06/18 1315  TempSrc: Core  PainSc:          Complications: dental damage

## 2018-01-06 NOTE — Anesthesia Procedure Notes (Signed)
Arterial Line Insertion Start/End4/30/2019 6:55 AM, 01/06/2018 6:59 AM Performed by: Julian Reil, CRNA, CRNA  Patient location: Pre-op. Preanesthetic checklist: patient identified, IV checked, site marked, risks and benefits discussed, surgical consent, monitors and equipment checked, pre-op evaluation and timeout performed Lidocaine 1% used for infiltration and patient sedated Left, radial was placed Catheter size: 20 G Hand hygiene performed  and maximum sterile barriers used  Allen's test indicative of satisfactory collateral circulation Attempts: 2 Procedure performed without using ultrasound guided technique. Following insertion, Biopatch and dressing applied. Post procedure assessment: normal  Patient tolerated the procedure well with no immediate complications. Additional procedure comments: 1st art line attempt by Dwan Bolt, SRNA and then 2nd attempt per [facility] CRNA.Marland Kitchen

## 2018-01-06 NOTE — Progress Notes (Signed)
Pre Procedure note for inpatients:   Cameron Camacho has been scheduled for Procedure(s): CORONARY ARTERY BYPASS GRAFTING (CABG) (N/A) TRANSESOPHAGEAL ECHOCARDIOGRAM (TEE) (N/A) today. The various methods of treatment have been discussed with the patient. After consideration of the risks, benefits and treatment options the patient has consented to the planned procedure.   The patient has been seen and labs reviewed. There are no changes in the patient's condition to prevent proceeding with the planned procedure today.  Recent labs:  Lab Results  Component Value Date   WBC 7.2 01/06/2018   HGB 15.1 01/06/2018   HCT 42.7 01/06/2018   PLT 259 01/06/2018   GLUCOSE 136 (H) 01/05/2018   CHOL 169 01/04/2018   TRIG 51 01/04/2018   HDL 56 01/04/2018   LDLCALC 103 (H) 01/04/2018   ALT 13 (L) 01/05/2018   AST 16 01/05/2018   NA 136 01/05/2018   K 3.6 01/05/2018   CL 104 01/05/2018   CREATININE 1.27 (H) 01/05/2018   BUN 11 01/05/2018   CO2 23 01/05/2018   TSH 1.405 01/03/2018   INR 0.98 01/05/2018   HGBA1C 6.0 (H) 01/05/2018    Delight Ovens, MD 01/06/2018 6:52 AM

## 2018-01-06 NOTE — Brief Op Note (Signed)
      [address]       [place] [address]             [phone]      01/06/2018  1:38 PM  PATIENT:  Cameron Camacho  62 y.o. male  PRE-OPERATIVE DIAGNOSIS:  CAD LMD  POST-OPERATIVE DIAGNOSIS:  CAD LMD  PROCEDURE:  Procedure(s):  CORONARY ARTERY BYPASS GRAFTING x 3 -LIMA to LAD -SVG to OM -SVG to RCA  ENDOSCOPIC HARVEST GREATER SAPHENOUS VEIN -Right Thigh  TRANSESOPHAGEAL ECHOCARDIOGRAM (TEE) (N/A)  SURGEON:  Surgeon(s) and Role:    * Delight Ovens, MD - Primary  PHYSICIAN ASSISTANT: Lowella Dandy PA-C  ANESTHESIA:   general  EBL:  500 mL   BLOOD ADMINISTERED: CELLSAVER  DRAINS: Left Pleural Chest Tubes, Mediastinal Chest Drains   LOCAL MEDICATIONS USED:     DISPOSITION OF SPECIMEN:  N/A  COUNTS:  YES  TOURNIQUET:  * No tourniquets in log *  DICTATION: .Dragon Dictation  PLAN OF CARE: Admit to inpatient   PATIENT DISPOSITION:  ICU - intubated and hemodynamically stable.   Delay start of Pharmacological VTE agent (>24hrs) due to surgical blood loss or risk of bleeding: yes

## 2018-01-07 ENCOUNTER — Encounter: Payer: Self-pay | Admitting: Physician Assistant

## 2018-01-07 ENCOUNTER — Inpatient Hospital Stay (HOSPITAL_COMMUNITY): Payer: Non-veteran care

## 2018-01-07 LAB — POCT I-STAT, CHEM 8
BUN: 12 mg/dL (ref 6–20)
CREATININE: 1 mg/dL (ref 0.61–1.24)
Calcium, Ion: 1.06 mmol/L — ABNORMAL LOW (ref 1.15–1.40)
Chloride: 99 mmol/L — ABNORMAL LOW (ref 101–111)
Glucose, Bld: 149 mg/dL — ABNORMAL HIGH (ref 65–99)
HEMATOCRIT: 34 % — AB (ref 39.0–52.0)
Hemoglobin: 11.6 g/dL — ABNORMAL LOW (ref 13.0–17.0)
Potassium: 4 mmol/L (ref 3.5–5.1)
Sodium: 136 mmol/L (ref 135–145)
TCO2: 23 mmol/L (ref 22–32)

## 2018-01-07 LAB — BASIC METABOLIC PANEL
Anion gap: 6 (ref 5–15)
BUN: 8 mg/dL (ref 6–20)
CALCIUM: 7.9 mg/dL — AB (ref 8.9–10.3)
CO2: 24 mmol/L (ref 22–32)
Chloride: 107 mmol/L (ref 101–111)
Creatinine, Ser: 0.98 mg/dL (ref 0.61–1.24)
GFR calc non Af Amer: >60 mL/min (ref >60)
Glucose, Bld: 102 mg/dL — ABNORMAL HIGH (ref 65–99)
Potassium: 4.2 mmol/L (ref 3.5–5.1)
SODIUM: 137 mmol/L (ref 135–145)

## 2018-01-07 LAB — CBC
HCT: 29.8 % — ABNORMAL LOW (ref 39.0–52.0)
HCT: 32.6 % — ABNORMAL LOW (ref 39.0–52.0)
HEMOGLOBIN: 11.4 g/dL — AB (ref 13.0–17.0)
Hemoglobin: 10.8 g/dL — ABNORMAL LOW (ref 13.0–17.0)
MCH: 33.3 pg (ref 26.0–34.0)
MCH: 32.6 pg (ref 26.0–34.0)
MCHC: 36.2 g/dL — ABNORMAL HIGH (ref 30.0–36.0)
MCHC: 35 g/dL (ref 30.0–36.0)
MCV: 92 fL (ref 78.0–100.0)
MCV: 93.1 fL (ref 78.0–100.0)
PLATELETS: 155 10*3/uL (ref 150–400)
Platelets: 137 10*3/uL — ABNORMAL LOW (ref 150–400)
RBC: 3.24 MIL/uL — ABNORMAL LOW (ref 4.22–5.81)
RBC: 3.5 MIL/uL — ABNORMAL LOW (ref 4.22–5.81)
RDW: 12.7 % (ref 11.5–15.5)
RDW: 13.1 % (ref 11.5–15.5)
WBC: 12.3 10*3/uL — ABNORMAL HIGH (ref 4.0–10.5)
WBC: 16.9 10*3/uL — ABNORMAL HIGH (ref 4.0–10.5)

## 2018-01-07 LAB — GLUCOSE, CAPILLARY
GLUCOSE-CAPILLARY: 123 mg/dL — AB (ref 65–99)
GLUCOSE-CAPILLARY: 94 mg/dL (ref 65–99)
GLUCOSE-CAPILLARY: 119 mg/dL — AB (ref 65–99)
GLUCOSE-CAPILLARY: 126 mg/dL — AB (ref 65–99)
GLUCOSE-CAPILLARY: 142 mg/dL — AB (ref 65–99)
Glucose-Capillary: 103 mg/dL — ABNORMAL HIGH (ref 65–99)
Glucose-Capillary: 105 mg/dL — ABNORMAL HIGH (ref 65–99)
Glucose-Capillary: 114 mg/dL — ABNORMAL HIGH (ref 65–99)
Glucose-Capillary: 121 mg/dL — ABNORMAL HIGH (ref 65–99)
Glucose-Capillary: 128 mg/dL — ABNORMAL HIGH (ref 65–99)
Glucose-Capillary: 144 mg/dL — ABNORMAL HIGH (ref 65–99)

## 2018-01-07 LAB — CREATININE, SERUM
CREATININE: 1.21 mg/dL (ref 0.61–1.24)
GFR calc Af Amer: >60 mL/min (ref >60)
GFR calc non Af Amer: >60 mL/min (ref >60)

## 2018-01-07 LAB — SURGICAL PCR SCREEN
MRSA, PCR: NEGATIVE
Staphylococcus aureus: NEGATIVE

## 2018-01-07 LAB — MAGNESIUM
Magnesium: 2.4 mg/dL (ref 1.7–2.4)
Magnesium: 2.1 mg/dL (ref 1.7–2.4)

## 2018-01-07 MED ORDER — INSULIN ASPART 100 UNIT/ML ~~LOC~~ SOLN: 0.0000 [IU] | SUBCUTANEOUS | Status: DC

## 2018-01-07 MED ORDER — LISINOPRIL 5 MG PO TABS
5.0000 mg | ORAL_TABLET | Freq: Every day | ORAL | Status: DC
  Administered 2018-01-07: 5 mg via ORAL
  Filled 2018-01-07: qty 1

## 2018-01-07 MED ORDER — INSULIN ASPART 100 UNIT/ML ~~LOC~~ SOLN
0.0000 [IU] | SUBCUTANEOUS | Status: DC
  Administered 2018-01-07 – 2018-01-08 (×5): 2 [IU] via SUBCUTANEOUS

## 2018-01-07 MED ORDER — ENOXAPARIN SODIUM 40 MG/0.4ML ~~LOC~~ SOLN
40.0000 mg | Freq: Every day | SUBCUTANEOUS | Status: DC
  Administered 2018-01-07 – 2018-01-11 (×5): 40 mg via SUBCUTANEOUS
  Filled 2018-01-07 (×5): qty 0.4

## 2018-01-07 MED FILL — Heparin Sodium (Porcine) Inj 1000 Unit/ML: INTRAMUSCULAR | Qty: 30 | Status: AC

## 2018-01-07 MED FILL — Lidocaine HCl(Cardiac) IV PF Soln Pref Syr 100 MG/5ML (2%): INTRAVENOUS | Qty: 5 | Status: AC

## 2018-01-07 MED FILL — Sodium Chloride IV Soln 0.9%: INTRAVENOUS | Qty: 2000 | Status: AC

## 2018-01-07 MED FILL — Magnesium Sulfate Inj 50%: INTRAMUSCULAR | Qty: 10 | Status: AC

## 2018-01-07 MED FILL — Heparin Sodium (Porcine) Inj 1000 Unit/ML: INTRAMUSCULAR | Qty: 10 | Status: AC

## 2018-01-07 MED FILL — Potassium Chloride Inj 2 mEq/ML: INTRAVENOUS | Qty: 40 | Status: AC

## 2018-01-07 MED FILL — Mannitol IV Soln 20%: INTRAVENOUS | Qty: 500 | Status: AC

## 2018-01-07 MED FILL — Electrolyte-R (PH 7.4) Solution: INTRAVENOUS | Qty: 3000 | Status: AC

## 2018-01-07 NOTE — Discharge Instructions (Signed)

## 2018-01-07 NOTE — Plan of Care (Signed)
  Problem: Clinical Measurements: Goal: Respiratory complications will improve Outcome: Completed/Met   Problem: Elimination: Goal: Will not experience complications related to urinary retention Outcome: Progressing   Problem: Activity: Goal: Ability to tolerate increased activity will improve Outcome: Progressing   Problem: Education: Goal: Ability to demonstrate proper wound care will improve Outcome: Progressing Goal: Knowledge of disease or condition will improve Outcome: Progressing Goal: Knowledge of the prescribed therapeutic regimen will improve Outcome: Progressing   Problem: Cardiac: Goal: Hemodynamic stability will improve Outcome: Progressing   Problem: Clinical Measurements: Goal: Postoperative complications will be avoided or minimized Outcome: Progressing   Problem: Respiratory: Goal: Respiratory status will improve Outcome: Completed/Met   Problem: Urinary Elimination: Goal: Ability to achieve and maintain adequate renal perfusion and functioning will improve Outcome: Progressing

## 2018-01-07 NOTE — Progress Notes (Signed)
Patient ID: Cameron Camacho, male   DOB: 25-Oct-1955, 62 y.o.   MRN: [id number] EVENING ROUNDS NOTE :     [address]       [place] [address]             [phone]                 1 Day Post-Op Procedure(s) (LRB): CORONARY ARTERY BYPASS GRAFTING (CABG) x three, using left internal mammary artery and right leg greater saphenous vein harvested endoscopically (N/A) TRANSESOPHAGEAL ECHOCARDIOGRAM (TEE) (N/A)  Total Length of Stay:  LOS: 3 days  BP (!) 145/61 (BP Location: Right Arm)   Pulse 77   Temp 98.5 F (36.9 C) (Oral)   Resp (!) 23   Ht  (1.803 m)   Wt 202 lb (91.6 kg)   SpO2 100%   BMI 28.17 kg/m   .Intake/Output      04/30 0701 - 05/01 0700 05/01 0701 - 05/02 0700   P.O.  240   I.V. (mL/kg) 4860.8 (53.1) 143.8 (1.6)   Blood 350    NG/GT 0    IV Piggyback 1100 100   Total Intake(mL/kg) 6310.8 (68.9) 483.8 (5.3)   Urine (mL/kg/hr) 2715 (1.2) 290 (0.3)   Emesis/NG output 0    Other 1170    Blood 500    Chest Tube 350 10   Total Output 4735 300   Net +1575.8 +183.8          . sodium chloride Stopped (01/07/18 1100)  . sodium chloride 250 mL (01/07/18 1613)  . sodium chloride Stopped (01/07/18 0900)  . cefUROXime (ZINACEF)  IV Stopped (01/07/18 1646)  . dexmedetomidine (PRECEDEX) IV infusion Stopped (01/07/18 0600)  . lactated ringers    . lactated ringers    . lactated ringers Stopped (01/07/18 0900)  . nitroGLYCERIN Stopped (01/06/18 1730)  . nitroPRUSSide    . phenylephrine (NEO-SYNEPHRINE) Adult infusion Stopped (01/07/18 0800)     Lab Results  Component Value Date   WBC 16.9 (H) 01/07/2018   HGB 11.4 (L) 01/07/2018   HCT 32.6 (L) 01/07/2018   PLT 155 01/07/2018   GLUCOSE 149 (H) 01/07/2018   CHOL 169 01/04/2018   TRIG 51 01/04/2018   HDL 56 01/04/2018   LDLCALC 103 (H) 01/04/2018   ALT 13 (L) 01/05/2018   AST 16 01/05/2018   NA 136 01/07/2018   K 4.0 01/07/2018   CL 99 (L) 01/07/2018   CREATININE 1.21 01/07/2018   BUN 12 01/07/2018   CO2 24 01/07/2018   TSH 1.405 01/03/2018   INR 1.31 01/06/2018   HGBA1C 6.0 (H) 01/05/2018   Stable day    Delight Ovens MD  Beeper [phone] Office [phone] 01/07/2018 6:00 PM

## 2018-01-07 NOTE — Discharge Summary (Signed)
Physician Discharge Summary  Patient ID: Cameron Camacho MRN: [id number] DOB/AGE: 1956-02-03 62 y.o.  Admit date: 01/03/2018 Discharge date: 01/12/2018  Admission Diagnoses:  Patient Active Problem List   Diagnosis Date Noted  . Family history of coronary artery disease in father 01/05/2018  . Coronary artery calcification seen on CAT scan   . Acute coronary syndrome (HCC)   . Prediabetes   . Dyslipidemia, goal LDL below 70   . Essential hypertension   . Unstable angina (HCC) 01/03/2018   Discharge Diagnoses:   Patient Active Problem List   Diagnosis Date Noted  . S/P CABG x 3 01/06/2018  . Family history of coronary artery disease in father 01/05/2018  . Coronary artery calcification seen on CAT scan   . Acute coronary syndrome (HCC)   . Prediabetes   . Dyslipidemia, goal LDL below 70   . Essential hypertension   . Unstable angina (HCC) 01/03/2018   Discharged Condition: good  History of Present Illness:  Cameron Camacho is a 62 yo AA male with no previous cardiac history.  He noticed he developed increased shortness of breath with exertion.  He notes this mainly occurs when walking to work.  He was awoke from sleep on Friday with chest discomfort.  This resolved spontaneously and he presented to the ED on Saturday 01/03/2018. EKG showed NSR with T wave inversions and prolonged QT.  Troponin was normal. He was diagnosed with unstable angina and admitted for further observation.     Hospital Course:   He had no further chest pain during admission.  He underwent cardiac catheterization on 01/05/2018.  He was noted to have CAD that was not felt to be amenable to PCI and bypass surgery would be indicated.  TCTS consult was requested and he was evaluated by Dr. Tyrone Sage, at which time patient initially did not want to have surgery.  However, after further discussion of inability to place stents, the risks and benefits of bypass surgery he was agreeable to proceed.   He was taken to the  operating room on 01/06/2018.  He underwent CABG x 3 utilizing LIMA to LAD, SVG to OM, and SVG to RCA.  He also underwent endoscopic harvest of greater saphenous vein from his right thigh.   He tolerated the procedure without difficulty and was taken to the SICU in stable condition.  He was extubated the evening of surgery.  During his stay in the SICU he was weaned off Neo Synephrine as tolerated.  His chest tubes and arterial lines were removed without difficulty.  He was hypertensive and started on low dose Lisinopril which was titrated as tolerated.  He was ambulating in the SICU in stable condition.  He was felt medically stable for transfer to the telemetry unit on 01/08/2018.  He continued to make progress.  He had some sinus ectopy felt to be due to low potassium.  He received supplementation and this resolved.  He continued to maintain NSR and his pacing wires were removed without difficulty.  He continued to be hypertensive and was started on Norvasc for additional control.  He continues to ambulate without difficulty.  He is tolerating a regular diet.  He is medically stable for discharge home today.    Significant Diagnostic Studies: angiography:    Dist LM to Ost LAD lesion is 95% stenosed.  Mid LM to Dist LM lesion is 90% stenosed.  Ost Cx to Prox Cx lesion is 70% stenosed.  Prox RCA lesion is 20% stenosed.  Mid RCA lesion is 70% stenosed.  Dist RCA lesion is 50% stenosed.  Ost RPDA lesion is 60% stenosed.  RPDA lesion is 55% stenosed.   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  Treatments: surgery:    Coronary artery bypass grafting x3 with the left internal mammary to the left anterior descending coronary artery, reverse saphenous vein graft to the circumflex coronary  artery, reverse saphenous vein graft to the distal right coronary artery with right thigh greater saphenous endoscopic vein harvesting.  Discharge Exam: Blood pressure 134/70, pulse 86, temperature 99.2 F (37.3 C), temperature source Oral, resp. rate (!) 24, height  (1.803 m), weight 204 lb 11.2 oz (92.9 kg), SpO2 98 %.   General appearance: alert, cooperative and no distress Heart: regular rate and rhythm Lungs: clear to auscultation bilaterally Abdomen: soft, non-tender; bowel sounds normal; no masses,  no organomegaly Extremities: edema trace Wound: clean and dry  Disposition: Discharge disposition: 01-Home or Self Care   Discharge Medications:   The patient has been discharged on:   1.Beta Blocker:  Yes [ x  ]                              No   [   ]                              If No, reason:  2.Ace Inhibitor/ARB: Yes [ x  ]                                     No  [    ]                                     If No, reason:  3.Statin:   Yes [ x  ]                  No  [   ]                  If No, reason:  4.Ecasa:  Yes  [x   ]                  No   [   ]                  If No, reason:     Discharge Instructions    Amb Referral to Cardiac Rehabilitation   Complete by:  As directed    Diagnosis:  CABG   CABG X ___:  3   Ambulatory referral to Nutrition and Diabetic Education   Complete by:  As directed    Pre-diabetes-     Allergies as of 01/12/2018   No Known Allergies     Medication List    STOP taking these medications   ALEVE 220 MG tablet Generic drug:  naproxen sodium   aspirin 325 MG tablet Replaced by:  aspirin 325 MG EC tablet     TAKE these medications  acetaminophen 325 MG tablet Commonly known as:  TYLENOL Take 2 tablets (650 mg total) by mouth every 6 (six) hours as needed for mild pain or headache.   amLODipine 5 MG tablet Commonly known as:  NORVASC Take 1 tablet (5 mg total) by mouth daily.   APPLE CIDER VINEGAR PO Take  15 mLs by mouth daily. Mix in 8 oz water and drink   aspirin 325 MG EC tablet Take 1 tablet (325 mg total) by mouth daily. Replaces:  aspirin 325 MG tablet   atorvastatin 80 MG tablet Commonly known as:  LIPITOR Take 1 tablet (80 mg total) by mouth daily at 6 PM.   CLEAR EYES OP Place 1 drop into both eyes daily as needed (tired eyes).   furosemide 40 MG tablet Commonly known as:  LASIX Take 1 tablet (40 mg total) by mouth daily.   Garlic 1000 MG Caps Take 1,000 mg by mouth daily.   lisinopril 10 MG tablet Commonly known as:  PRINIVIL,ZESTRIL Take 1 tablet (10 mg total) by mouth daily.   metoprolol tartrate 25 MG tablet Commonly known as:  LOPRESSOR Take 1 tablet (25 mg total) by mouth 2 (two) times daily.   nitroGLYCERIN 0.4 MG SL tablet Commonly known as:  NITROSTAT Place 1 tablet (0.4 mg total) under the tongue every 5 (five) minutes x 3 doses as needed for chest pain.   oxyCODONE 5 MG immediate release tablet Commonly known as:  Oxy IR/ROXICODONE Take 1 tablet (5 mg total) by mouth every 4 (four) hours as needed for severe pain.   oxymetazoline 0.05 % nasal spray Commonly known as:  AFRIN Place 1 spray into both nostrils 2 (two) times daily.   potassium chloride SA 20 MEQ tablet Commonly known as:  K-DUR,KLOR-CON Take 2 tablets (40 mEq total) by mouth 2 (two) times daily. For 2 days, then decrease to 40 mg daily      Follow-up Information    [facility] Follow up.   Why:  Your A1C was elevated suggesting pre-diabetes. Your CT scan also showed calcified lymph nodes that indicate prior inflammation. You should follow up with primary care for this. Contact information: [address] [facility] [place] [address] [phone]        Delight Ovens, MD Follow up on 02/12/2018.   Specialty:  Cardiothoracic Surgery Why:  Appointment is at 2:30, please get CXR at 2:00 at [facility] located on first floor of our office  building Contact information: [address] [place] [address] [phone]           Signed: Lowella Dandy 01/12/2018, 7:45 AM

## 2018-01-07 NOTE — Progress Notes (Addendum)
TCTS DAILY ICU PROGRESS NOTE                   [address]            [place] [address]          [phone]   1 Day Post-Op Procedure(s) (LRB): CORONARY ARTERY BYPASS GRAFTING (CABG) x three, using left internal mammary artery and right leg greater saphenous vein harvested endoscopically (N/A) TRANSESOPHAGEAL ECHOCARDIOGRAM (TEE) (N/A)  Total Length of Stay:  LOS: 3 days   Subjective:  Patient states he is doing better.   He denies pain currently stating pain medication has been taken and has his pain under control.  Denies N/V Objective: Vital signs in last 24 hours: Temp:  [96.3 F (35.7 C)-99.7 F (37.6 C)] 99.1 F (37.3 C) (05/01 0700) Pulse Rate:  [69-89] 80 (05/01 0700) Cardiac Rhythm: Atrial paced (05/01 0000) Resp:  [12-29] 15 (05/01 0700) BP: (85-139)/(40-76) 130/76 (05/01 0600) SpO2:  [94 %-100 %] 98 % (05/01 0700) Arterial Line BP: (89-169)/(46-84) 113/46 (05/01 0700) FiO2 (%):  [40 %-50 %] 40 % (04/30 1600) Weight:  [202 lb (91.6 kg)] 202 lb (91.6 kg) (05/01 0600)  Filed Weights   01/05/18 0538 01/06/18 0500 01/07/18 0600  Weight: 203 lb 1.6 oz (92.1 kg) 202 lb 9.6 oz (91.9 kg) 202 lb (91.6 kg)    Weight change: -9.6 oz (-0.273 kg)   Hemodynamic parameters for last 24 hours: PAP: (21-56)/(6-23) 31/12 CO:  [3.4 L/min-6 L/min] 4.4 L/min CI:  [1.6 L/min/m2-2.8 L/min/m2] 2.1 L/min/m2  Intake/Output from previous day: 04/30 0701 - 05/01 0700 In: 6310.8 [I.V.:4860.8; Blood:350; IV Piggyback:1100] Out: 4705 [Urine:2685; Blood:500; Chest Tube:350]  Current Meds: Scheduled Meds: . acetaminophen  1,000 mg Oral Q6H   Or  . acetaminophen (TYLENOL) oral liquid 160 mg/5 mL  1,000 mg Per Tube Q6H  . aspirin EC  325 mg Oral Daily   Or  . aspirin  324 mg Per Tube Daily  . atorvastatin  80 mg Oral q1800  . bisacodyl  10 mg Oral Daily   Or  . bisacodyl  10 mg Rectal Daily  . docusate sodium  200 mg Oral Daily  . enoxaparin (LOVENOX) injection   40 mg Subcutaneous QHS  . insulin aspart  0-24 Units Subcutaneous Q4H  . metoCLOPramide (REGLAN) injection  10 mg Intravenous Q6H  . metoprolol tartrate  12.5 mg Oral BID   Or  . metoprolol tartrate  12.5 mg Per Tube BID  . [START ON 01/08/2018] pantoprazole  40 mg Oral Daily  . sodium chloride flush  3 mL Intravenous Q12H   Continuous Infusions: . sodium chloride 20 mL/hr at 01/07/18 0700  . sodium chloride    . sodium chloride 10 mL/hr at 01/07/18 0700  . albumin human    . cefUROXime (ZINACEF)  IV Stopped (01/07/18 0401)  . dexmedetomidine (PRECEDEX) IV infusion Stopped (01/07/18 0600)  . famotidine (PEPCID) IV Stopped (01/06/18 1405)  . lactated ringers    . lactated ringers    . lactated ringers 20 mL/hr at 01/07/18 0700  . nitroGLYCERIN Stopped (01/06/18 1730)  . nitroPRUSSide    . phenylephrine (NEO-SYNEPHRINE) Adult infusion 5 mcg/min (01/07/18 0700)   PRN Meds:.sodium chloride, albumin human, lactated ringers, metoprolol tartrate, midazolam, morphine injection, ondansetron (ZOFRAN) IV, oxyCODONE, sodium chloride flush, traMADol  General appearance: alert, cooperative and no distress Heart: regular rate and rhythm Lungs: clear to auscultation bilaterally Abdomen: soft, non-tender; bowel sounds normal; no  masses,  no organomegaly Extremities: edema trace Wound: clean and dry  Lab Results: CBC: Recent Labs    01/06/18 1836 01/07/18 0339  WBC 14.3* 12.3*  HGB 11.2* 10.8*  HCT 31.9* 29.8*  PLT 153 137*   BMET:  Recent Labs    01/06/18 0547  01/06/18 1827 01/06/18 1836 01/07/18 0339  NA 139   < > 139  --  137  K 3.8   < > 4.4  --  4.2  CL 102   < > 104  --  107  CO2 26  --   --   --  24  GLUCOSE 133*   < > 121*  --  102*  BUN 7   < > 7  --  8  CREATININE 1.16   < > 0.90 1.04 0.98  CALCIUM 9.2  --   --   --  7.9*   < > = values in this interval not displayed.    CMET: Lab Results  Component Value Date   WBC 12.3 (H) 01/07/2018   HGB 10.8 (L)  01/07/2018   HCT 29.8 (L) 01/07/2018   PLT 137 (L) 01/07/2018   GLUCOSE 102 (H) 01/07/2018   CHOL 169 01/04/2018   TRIG 51 01/04/2018   HDL 56 01/04/2018   LDLCALC 103 (H) 01/04/2018   ALT 13 (L) 01/05/2018   AST 16 01/05/2018   NA 137 01/07/2018   K 4.2 01/07/2018   CL 107 01/07/2018   CREATININE 0.98 01/07/2018   BUN 8 01/07/2018   CO2 24 01/07/2018   TSH 1.405 01/03/2018   INR 1.31 01/06/2018   HGBA1C 6.0 (H) 01/05/2018      PT/INR:  Recent Labs    01/06/18 1323  LABPROT 16.1*  INR 1.31   Radiology: Dg Chest Port 1 View  Result Date: 01/06/2018 CLINICAL DATA:  Postop CABG. EXAM: PORTABLE CHEST 1 VIEW COMPARISON:  CT 01/04/2018.  Radiographs 01/03/2018. FINDINGS: 1321 hours. Interval median sternotomy and CABG. The endotracheal tube tip is 2 cm above the carina. Right IJ Swan-Ganz catheter projects into the main pulmonary artery. Nasogastric tube projects to the gastric fundus. Left chest tube is in place. The heart size and mediastinal contours are stable. There is minimal atelectasis at the lung bases. No edema, pneumothorax or significant pleural effusion. IMPRESSION: No demonstrated significant complication following CABG. Support system as above. Electronically Signed   By: Carey Bullocks M.D.   On: 01/06/2018 13:48     Assessment/Plan: S/P Procedure(s) (LRB): CORONARY ARTERY BYPASS GRAFTING (CABG) x three, using left internal mammary artery and right leg greater saphenous vein harvested endoscopically (N/A) TRANSESOPHAGEAL ECHOCARDIOGRAM (TEE) (N/A)  1. CV- NSR, rate in the 70s under pacer, SBP in the 130s- wean Neo as tolerated 2. Pulm- CXR with minimal atelectasis, trace left pleural effusion, wean oxygen as tolerated, continue use of IS, CT output is low 3. Renal- creatinine WNL. Weight is elevated about 10 lbs above baseline, however patient is not edematous on exam... No diuretics at this time 4. Expected post operative blood loss anemia, mild Hgb at 10.6 5.  CBGs- controlled, off insulin drip, will start SSIP no insulin indicated at this time 6. Dispo- patient stable, doing well, will wean Neo as tolerated, d/c chest tubes and lines, POD #1 progression orders    Lowella Dandy 01/07/2018 7:54 AM   Stable this am D/c lines and tubes  I have seen and examined Carlis Abbott and agree with the above assessment  and  plan.  Delight Ovens MD Beeper [phone] Office [phone] 01/07/2018 8:33 AM

## 2018-01-08 ENCOUNTER — Inpatient Hospital Stay (HOSPITAL_COMMUNITY): Payer: Non-veteran care

## 2018-01-08 DIAGNOSIS — I214 Non-ST elevation (NSTEMI) myocardial infarction: Secondary | ICD-10-CM | POA: Diagnosis not present

## 2018-01-08 LAB — CBC
HCT: 32 % — ABNORMAL LOW (ref 39.0–52.0)
Hemoglobin: 11 g/dL — ABNORMAL LOW (ref 13.0–17.0)
MCH: 32.2 pg (ref 26.0–34.0)
MCHC: 34.4 g/dL (ref 30.0–36.0)
MCV: 93.6 fL (ref 78.0–100.0)
Platelets: 165 10*3/uL (ref 150–400)
RBC: 3.42 MIL/uL — AB (ref 4.22–5.81)
RDW: 13.4 % (ref 11.5–15.5)
WBC: 19.6 10*3/uL — AB (ref 4.0–10.5)

## 2018-01-08 LAB — GLUCOSE, CAPILLARY
GLUCOSE-CAPILLARY: 145 mg/dL — AB (ref 65–99)
Glucose-Capillary: 152 mg/dL — ABNORMAL HIGH (ref 65–99)

## 2018-01-08 LAB — BASIC METABOLIC PANEL
Anion gap: 7 (ref 5–15)
BUN: 10 mg/dL (ref 6–20)
CALCIUM: 8.4 mg/dL — AB (ref 8.9–10.3)
CO2: 27 mmol/L (ref 22–32)
CREATININE: 1.11 mg/dL (ref 0.61–1.24)
Chloride: 101 mmol/L (ref 101–111)
Glucose, Bld: 155 mg/dL — ABNORMAL HIGH (ref 65–99)
Potassium: 4 mmol/L (ref 3.5–5.1)
SODIUM: 135 mmol/L (ref 135–145)

## 2018-01-08 MED ORDER — SODIUM CHLORIDE 0.9% FLUSH
3.0000 mL | Freq: Two times a day (BID) | INTRAVENOUS | Status: DC
  Administered 2018-01-08 – 2018-01-11 (×4): 3 mL via INTRAVENOUS

## 2018-01-08 MED ORDER — LISINOPRIL 10 MG PO TABS
10.0000 mg | ORAL_TABLET | Freq: Every day | ORAL | Status: DC
  Administered 2018-01-08 – 2018-01-09 (×2): 10 mg via ORAL
  Filled 2018-01-08 (×2): qty 1

## 2018-01-08 MED ORDER — MOVING RIGHT ALONG BOOK
Freq: Once | Status: DC
  Filled 2018-01-08: qty 1

## 2018-01-08 MED ORDER — SORBITOL 70 % SOLN
60.0000 mL | Freq: Once | Status: AC
  Administered 2018-01-08: 60 mL via ORAL
  Filled 2018-01-08: qty 60

## 2018-01-08 MED ORDER — SODIUM CHLORIDE 0.9 % IV SOLN: 250.0000 mL | INTRAVENOUS | Status: DC | PRN

## 2018-01-08 MED ORDER — ZOLPIDEM TARTRATE 5 MG PO TABS: 5.0000 mg | ORAL_TABLET | Freq: Every evening | ORAL | Status: DC | PRN

## 2018-01-08 MED ORDER — SODIUM CHLORIDE 0.9% FLUSH: 3.0000 mL | INTRAVENOUS | Status: DC | PRN

## 2018-01-08 NOTE — Progress Notes (Signed)
Progress Note  Patient Name: Cameron Camacho Date of Encounter: 01/08/2018  Primary Cardiologist: Armanda Magic, MD   Subjective   Progressing well.  Denies chest pain or shortness of breath.  Has ambulated around the ICU a few times.  Inpatient Medications    Scheduled Meds: . acetaminophen  1,000 mg Oral Q6H   Or  . acetaminophen (TYLENOL) oral liquid 160 mg/5 mL  1,000 mg Per Tube Q6H  . aspirin EC  325 mg Oral Daily   Or  . aspirin  324 mg Per Tube Daily  . atorvastatin  80 mg Oral q1800  . bisacodyl  10 mg Oral Daily   Or  . bisacodyl  10 mg Rectal Daily  . docusate sodium  200 mg Oral Daily  . enoxaparin (LOVENOX) injection  40 mg Subcutaneous QHS  . lisinopril  10 mg Oral Daily  . metoprolol tartrate  12.5 mg Oral BID   Or  . metoprolol tartrate  12.5 mg Per Tube BID  . moving right along book   Does not apply Once  . pantoprazole  40 mg Oral Daily  . sodium chloride flush  3 mL Intravenous Q12H  . sodium chloride flush  3 mL Intravenous Q12H   Continuous Infusions: . sodium chloride Stopped (01/07/18 1100)  . sodium chloride Stopped (01/08/18 0400)  . sodium chloride Stopped (01/07/18 0900)  . sodium chloride    . dexmedetomidine (PRECEDEX) IV infusion Stopped (01/07/18 0600)  . lactated ringers    . lactated ringers    . lactated ringers Stopped (01/07/18 0900)   PRN Meds: sodium chloride, sodium chloride, lactated ringers, metoprolol tartrate, midazolam, morphine injection, ondansetron (ZOFRAN) IV, oxyCODONE, sodium chloride flush, sodium chloride flush, traMADol, zolpidem   Vital Signs    Vitals:   01/08/18 0700 01/08/18 0800 01/08/18 0900 01/08/18 1212  BP: (!) 164/63 132/66 132/64   Pulse: 81 69 69   Resp: Temp:    98.1 F (36.7 C)  TempSrc:    Oral  SpO2: 94% 93% 95%   Weight:      Height:        Intake/Output Summary (Last 24 hours) at 01/08/2018 1223 Last data filed at 01/08/2018 1000 Gross per 24 hour  Intake 1257.83 ml    Output 930 ml  Net 327.83 ml   Filed Weights   01/06/18 0500 01/07/18 0600 01/08/18 0500  Weight: 202 lb 9.6 oz (91.9 kg) 202 lb (91.6 kg) 214 lb 11.7 oz (97.4 kg)    Telemetry     Normal sinus rhythm - Personally Reviewed   Physical Exam  Alert, oriented male sitting up  on a bedside chair GEN: No acute distress.   Neck: No JVD Cardiac: RRR, no murmurs, rubs, or gallops.  Respiratory: Clear to auscultation bilaterally. GI: Soft, nontender MS:   trace pretibial edema; No deformity. Neuro:  Nonfocal  Psych: Normal affect   Labs    Chemistry Recent Labs  Lab 01/03/18 1415  01/05/18 2133 01/06/18 0547  01/07/18 0339 01/07/18 1625 01/07/18 1643 01/08/18 0421  NA 138   < > 136 139   < > 137 136  --  135  K 3.5   < > 3.6 3.8   < > 4.2 4.0  --  4.0  CL 104   < > 104 102   < > 107 99*  --  101  CO2 26   < > 23 26  --  24  --   --  27  GLUCOSE 114*   < > 136* 133*   < > 102* 149*  --  155*  BUN 6   < > 11 7   < > 8 12  --  10  CREATININE 0.98   < > 1.27* 1.16   < > 0.98 1.00 1.21 1.11  CALCIUM 8.7*   < > 8.8* 9.2  --  7.9*  --   --  8.4*  PROT 6.8  --  6.9  --   --   --   --   --   --   ALBUMIN 3.7  --  3.8  --   --   --   --   --   --   AST 19  --  16  --   --   --   --   --   --   ALT 17  --  13*  --   --   --   --   --   --   ALKPHOS 72  --  77  --   --   --   --   --   --   BILITOT 0.8  --  0.5  --   --   --   --   --   --   GFRNONAA >60   < > >60 >60   < > >60  --  >60 >60  GFRAA >60   < > >60 >60   < > >60  --  >60 >60  ANIONGAP 8   < > 9 11  --  6  --   --  7   < > = values in this interval not displayed.     Hematology Recent Labs  Lab 01/07/18 0339 01/07/18 1625 01/07/18 1643 01/08/18 0421  WBC 12.3*  --  16.9* 19.6*  RBC 3.24*  --  3.50* 3.42*  HGB 10.8* 11.6* 11.4* 11.0*  HCT 29.8* 34.0* 32.6* 32.0*  MCV 92.0  --  93.1 93.6  MCH 33.3  --  32.6 32.2  MCHC 36.2*  --  35.0 34.4  RDW 12.7  --  13.1 13.4  PLT 137*  --  155 165    Cardiac  Enzymes Recent Labs  Lab 01/03/18 1935 01/04/18 0035 01/04/18 0513  TROPONINI 0.05* 0.07* 0.05*    Recent Labs  Lab 01/03/18 1429  TROPIPOC 0.06     BNP Recent Labs  Lab 01/03/18 1609  BNP 102.8*     DDimer  Recent Labs  Lab 01/03/18 1609  DDIMER 3.50*     Radiology    Dg Chest Port 1 View  Result Date: 01/08/2018 CLINICAL DATA:  Cough, status post coronary artery bypass graft. EXAM: PORTABLE CHEST 1 VIEW COMPARISON:  Radiograph of Jan 07, 2018. FINDINGS: Stable cardiomegaly. Status post coronary artery bypass graft. No pneumothorax is noted. Swan-Ganz catheter has been removed. Right internal jugular venous sheath remains. Stable bibasilar edema or atelectasis is noted with small pleural effusions. Bony thorax is unremarkable. IMPRESSION: Stable bibasilar atelectasis or edema with associated pleural effusions. Electronically Signed   By: Lupita Raider, M.D.   On: 01/08/2018 09:18   Dg Chest Port 1 View  Result Date: 01/07/2018 CLINICAL DATA:  Chest tube, post CABG EXAM: PORTABLE CHEST 1 VIEW COMPARISON:  01/06/2018 FINDINGS: Prior CABG. Left chest tube in place. Swan-Ganz catheter is unchanged. Interval removal of endotracheal tube and NG tube. Increasing bibasilar atelectasis and mild vascular congestion. Cardiomegaly. No  pneumothorax. Small bilateral effusions. IMPRESSION: Interval extubation. Increasing vascular congestion, bibasilar atelectasis and small effusions. No pneumothorax. Electronically Signed   By: Charlett Nose M.D.   On: 01/07/2018 10:20   Dg Chest Port 1 View  Result Date: 01/06/2018 CLINICAL DATA:  Postop CABG. EXAM: PORTABLE CHEST 1 VIEW COMPARISON:  CT 01/04/2018.  Radiographs 01/03/2018. FINDINGS: 1321 hours. Interval median sternotomy and CABG. The endotracheal tube tip is 2 cm above the carina. Right IJ Swan-Ganz catheter projects into the main pulmonary artery. Nasogastric tube projects to the gastric fundus. Left chest tube is in place. The heart size  and mediastinal contours are stable. There is minimal atelectasis at the lung bases. No edema, pneumothorax or significant pleural effusion. IMPRESSION: No demonstrated significant complication following CABG. Support system as above. Electronically Signed   By: Carey Bullocks M.D.   On: 01/06/2018 13:48     Patient Profile     62 y.o. male with NSTEMI, critical left main disease, treated with CABG 01/06/18  Assessment & Plan    Non-STEMI: Patient found to have critical distal left main and multivessel disease, now status post CABG: He is progressing very well in the early postoperative period.  His heart rhythm is stable.  Plans noted to transfer him to the floor today.  Medical program is reviewed and appropriate with aspirin, beta-blocker, and ACE inhibitor, and a high intensity statin drug.  Would be reasonable to start him on Plavix 75 mg daily without a loading dose if the surgical team is agreeable.  For questions or updates, please contact [facility] Please consult [email or web address] for contact info under Cardiology/STEMI.      Signed, Tonny Bollman, MD  01/08/2018, 12:23 PM

## 2018-01-08 NOTE — Progress Notes (Addendum)
      [address]       [place] [address]             [phone]      2 Days Post-Op Procedure(s) (LRB): CORONARY ARTERY BYPASS GRAFTING (CABG) x three, using left internal mammary artery and right leg greater saphenous vein harvested endoscopically (N/A) TRANSESOPHAGEAL ECHOCARDIOGRAM (TEE) (N/A)   Subjective:  [name] is doing okay.  He states that he did not sleep last night.  He has some mild nausea but has not vomited.  Objective: Vital signs in last 24 hours: Temp:  [98.2 F (36.8 C)-99 F (37.2 C)] 98.3 F (36.8 C) (05/02 0400) Pulse Rate:  [57-86] 81 (05/02 0700) Cardiac Rhythm: Normal sinus rhythm (05/02 0000) Resp:  [13-29] 18 (05/02 0700) BP: (99-176)/(57-86) 164/63 (05/02 0700) SpO2:  [90 %-100 %] 94 % (05/02 0700) Arterial Line BP: (115)/(47) 115/47 (05/01 0800) Weight:  [214 lb 11.7 oz (97.4 kg)] 214 lb 11.7 oz (97.4 kg) (05/02 0500)  Hemodynamic parameters for last 24 hours: PAP: (34)/(16) 34/16  Intake/Output from previous day: 05/01 0701 - 05/02 0700 In: 1321.6 [P.O.:820; I.V.:301.6; IV Piggyback:200] Out: 1110 [Urine:1100; Chest Tube:10]  General appearance: alert, cooperative and no distress Heart: regular rate and rhythm Lungs: clear to auscultation bilaterally Abdomen: soft, non-tender; bowel sounds normal; no masses,  no organomegaly Extremities: edema trace Wound: clean and dry  Lab Results: Recent Labs    01/07/18 1643 01/08/18 0421  WBC 16.9* 19.6*  HGB 11.4* 11.0*  HCT 32.6* 32.0*  PLT 155 165   BMET:  Recent Labs    01/07/18 0339 01/07/18 1625 01/07/18 1643 01/08/18 0421  NA 137 136  --  135  K 4.2 4.0  --  4.0  CL 107 99*  --  101  CO2 24  --   --  27  GLUCOSE 102* 149*  --  155*  BUN 8 12  --  10  CREATININE 0.98 1.00 1.21 1.11  CALCIUM 7.9*  --   --  8.4*    PT/INR:  Recent Labs    01/06/18 1323  LABPROT 16.1*  INR 1.31   ABG    Component Value Date/Time   PHART 7.352 01/06/2018 1727    HCO3 20.5 01/06/2018 1727   TCO2 23 01/07/2018 1625   ACIDBASEDEF 5.0 (H) 01/06/2018 1727   O2SAT 96.0 01/06/2018 1727   CBG (last 3)  Recent Labs    01/07/18 1527 01/07/18 2348 01/08/18 0336  GLUCAP 142* 144* 145*    Assessment/Plan: S/P Procedure(s) (LRB): CORONARY ARTERY BYPASS GRAFTING (CABG) x three, using left internal mammary artery and right leg greater saphenous vein harvested endoscopically (N/A) TRANSESOPHAGEAL ECHOCARDIOGRAM (TEE) (N/A)  1. CV- NSR, remains hypertensive- will continue Lopressor, will increase Lisinopril to 10 mg daily 2. Pulm- no acute issues, off oxygen, continue IS 3. Renal- creatinine stable, weight is elevated per numbers, patient is not edematous on exam, will hold off on diuretics for now 4. Expected post operative blood loss anemia, mild Hgb at 11.0 5. Dispo- patient stable, increase Lisinopril for better BP control, will transfer to 4E   LOS: 4 days    Lowella Dandy 01/08/2018  patient examined and medical record reviewed,agree with above note. Hold on plavix for now Kathlee Nations Trigt III 01/09/2018

## 2018-01-09 ENCOUNTER — Inpatient Hospital Stay (HOSPITAL_COMMUNITY): Payer: Non-veteran care

## 2018-01-09 LAB — BASIC METABOLIC PANEL
Anion gap: 8 (ref 5–15)
BUN: 9 mg/dL (ref 6–20)
CHLORIDE: 102 mmol/L (ref 101–111)
CO2: 29 mmol/L (ref 22–32)
CREATININE: 1.09 mg/dL (ref 0.61–1.24)
Calcium: 8.5 mg/dL — ABNORMAL LOW (ref 8.9–10.3)
GFR calc non Af Amer: >60 mL/min (ref >60)
Glucose, Bld: 128 mg/dL — ABNORMAL HIGH (ref 65–99)
Potassium: 4 mmol/L (ref 3.5–5.1)
SODIUM: 139 mmol/L (ref 135–145)

## 2018-01-09 LAB — GLUCOSE, CAPILLARY: GLUCOSE-CAPILLARY: 176 mg/dL — AB (ref 65–99)

## 2018-01-09 LAB — CBC
HCT: 33.2 % — ABNORMAL LOW (ref 39.0–52.0)
HEMOGLOBIN: 11.3 g/dL — AB (ref 13.0–17.0)
MCH: 32.2 pg (ref 26.0–34.0)
MCHC: 34 g/dL (ref 30.0–36.0)
MCV: 94.6 fL (ref 78.0–100.0)
Platelets: 173 10*3/uL (ref 150–400)
RBC: 3.51 MIL/uL — AB (ref 4.22–5.81)
RDW: 13.5 % (ref 11.5–15.5)
WBC: 16.6 10*3/uL — ABNORMAL HIGH (ref 4.0–10.5)

## 2018-01-09 NOTE — Plan of Care (Signed)
  Problem: Education: Goal: Knowledge of General Education information will improve Outcome: Progressing   Problem: Health Behavior/Discharge Planning: Goal: Ability to manage health-related needs will improve Outcome: Progressing   Problem: Clinical Measurements: Goal: Ability to maintain clinical measurements within normal limits will improve Outcome: Progressing   

## 2018-01-09 NOTE — Plan of Care (Signed)
  Problem: Education: Goal: Knowledge of General Education information will improve 01/09/2018 2031 by Renelda Mom, RN Outcome: Progressing 01/09/2018 2025 by Renelda Mom, RN Outcome: Progressing   Problem: Health Behavior/Discharge Planning: Goal: Ability to manage health-related needs will improve 01/09/2018 2031 by Renelda Mom, RN Outcome: Progressing 01/09/2018 2025 by Renelda Mom, RN Outcome: Progressing   Problem: Clinical Measurements: Goal: Ability to maintain clinical measurements within normal limits will improve 01/09/2018 2031 by Renelda Mom, RN Outcome: Progressing 01/09/2018 2025 by Renelda Mom, RN Outcome: Progressing

## 2018-01-09 NOTE — Care Management Note (Addendum)
Case Management Note  Patient Details  Name: Cameron Camacho MRN: [id number] Date of Birth: Mar 08, 1956  Subjective/Objective:                 Spoke to patient at the bedside. He states that prior to admission he did not go to the doctor, and he did not have any Rx's. He states he spoke w Dr. Tyrone Sage who agrees to follow him after DC. He states he has [employer] and he can choose any PCP, he does not want to use PCP through [place]. He states he also has medical insurance through [facility] where he works (this is not listed on facesheet). He states he plans on using commercial pharmacy, after DC. Current medications are low cost, $4 list at [facility].  He states he will have no difficulties paying out of pocket if he needs to. Medication Rx coverage being verified by CMAs. Will update w results.  [facility] so [facility] could fill them there with no cost. Patient states he will get them at [facility] regardless and use his Good Rx coupon/ app/ card. No further CM needs.   Action/Plan:   Expected Discharge Date:  01/06/18               Expected Discharge Plan:  Home/Self Care  In-House Referral:     Discharge planning Services  CM Consult  Post Acute Care Choice:    Choice offered to:     DME Arranged:    DME Agency:     HH Arranged:    HH Agency:     Status of Service:  Completed, signed off  If discussed at [employer] of Stay Meetings, dates discussed:    Additional Comments:  Lawerance Sabal, RN 01/09/2018, 11:12 AM

## 2018-01-10 MED ORDER — POTASSIUM CHLORIDE CRYS ER 20 MEQ PO TBCR
20.0000 meq | EXTENDED_RELEASE_TABLET | Freq: Every day | ORAL | Status: DC
  Administered 2018-01-10: 20 meq via ORAL
  Filled 2018-01-10: qty 1

## 2018-01-10 MED ORDER — METOPROLOL TARTRATE 25 MG PO TABS
25.0000 mg | ORAL_TABLET | Freq: Two times a day (BID) | ORAL | Status: DC
  Administered 2018-01-10 – 2018-01-12 (×5): 25 mg via ORAL
  Filled 2018-01-10 (×5): qty 1

## 2018-01-10 MED ORDER — LISINOPRIL 10 MG PO TABS
20.0000 mg | ORAL_TABLET | Freq: Every day | ORAL | Status: DC
  Administered 2018-01-10 – 2018-01-12 (×3): 20 mg via ORAL
  Filled 2018-01-10 (×3): qty 2

## 2018-01-10 MED ORDER — METOPROLOL TARTRATE 25 MG/10 ML ORAL SUSPENSION: 12.5000 mg | Freq: Two times a day (BID) | ORAL | Status: DC

## 2018-01-10 MED ORDER — FUROSEMIDE 40 MG PO TABS
40.0000 mg | ORAL_TABLET | Freq: Every day | ORAL | Status: DC
  Administered 2018-01-10 – 2018-01-12 (×3): 40 mg via ORAL
  Filled 2018-01-10 (×3): qty 1

## 2018-01-10 NOTE — Progress Notes (Addendum)
[address]       [place] [address]             [phone]      4 Days Post-Op Procedure(s) (LRB): CORONARY ARTERY BYPASS GRAFTING (CABG) x three, using left internal mammary artery and right leg greater saphenous vein harvested endoscopically (N/A) TRANSESOPHAGEAL ECHOCARDIOGRAM (TEE) (N/A) Subjective: Feels pretty well, no specific c/o  Objective: Vital signs in last 24 hours: Temp:  [98 F (36.7 C)-99.3 F (37.4 C)] 98.7 F (37.1 C) (05/04 0503) Pulse Rate:  [81-87] 87 (05/04 0503) Cardiac Rhythm: Normal sinus rhythm (05/04 0700) Resp:  [20-37] 37 (05/04 0503) BP: (146-175)/(61-84) 160/84 (05/04 0503) SpO2:  [97 %-99 %] 98 % (05/04 0503) Weight:  [93.5 kg (206 lb 1.6 oz)] 93.5 kg (206 lb 1.6 oz) (05/04 0503)  Hemodynamic parameters for last 24 hours:    Intake/Output from previous day: 05/03 0701 - 05/04 0700 In: -  Out: 660 [Urine:660] Intake/Output this shift: No intake/output data recorded.  General appearance: alert, cooperative and no distress Heart: regular rate and rhythm Lungs: clear to auscultation bilaterally Abdomen: benign Extremities: + edema Wound: dressing on chest with min old drainage, evh sites healing well  Lab Results: Recent Labs    01/08/18 0421 01/09/18 0403  WBC 19.6* 16.6*  HGB 11.0* 11.3*  HCT 32.0* 33.2*  PLT 165 173   BMET:  Recent Labs    01/08/18 0421 01/09/18 0403  NA 135 139  K 4.0 4.0  CL 101 102  CO2 27 29  GLUCOSE 155* 128*  BUN 10 9  CREATININE 1.11 1.09  CALCIUM 8.4* 8.5*    PT/INR: No results for input(s): LABPROT, INR in the last 72 hours. ABG    Component Value Date/Time   PHART 7.352 01/06/2018 1727   HCO3 20.5 01/06/2018 1727   TCO2 23 01/07/2018 1625   ACIDBASEDEF 5.0 (H) 01/06/2018 1727   O2SAT 96.0 01/06/2018 1727   CBG (last 3)  Recent Labs    01/07/18 2348 01/08/18 0336 01/09/18 2153  GLUCAP 144* 145* 176*    Meds Scheduled Meds: . acetaminophen  1,000  mg Oral Q6H   Or  . acetaminophen (TYLENOL) oral liquid 160 mg/5 mL  1,000 mg Per Tube Q6H  . aspirin EC  325 mg Oral Daily   Or  . aspirin  324 mg Per Tube Daily  . atorvastatin  80 mg Oral q1800  . bisacodyl  10 mg Oral Daily   Or  . bisacodyl  10 mg Rectal Daily  . docusate sodium  200 mg Oral Daily  . enoxaparin (LOVENOX) injection  40 mg Subcutaneous QHS  . lisinopril  10 mg Oral Daily  . metoprolol tartrate  12.5 mg Oral BID   Or  . metoprolol tartrate  12.5 mg Per Tube BID  . moving right along book   Does not apply Once  . pantoprazole  40 mg Oral Daily  . sodium chloride flush  3 mL Intravenous Q12H  . sodium chloride flush  3 mL Intravenous Q12H   Continuous Infusions: . sodium chloride Stopped (01/07/18 1100)  . sodium chloride Stopped (01/08/18 0400)  . sodium chloride Stopped (01/07/18 0900)  . sodium chloride    . dexmedetomidine (PRECEDEX) IV infusion Stopped (01/07/18 0600)  . lactated ringers    . lactated ringers    . lactated ringers Stopped (01/07/18 0900)   PRN Meds:.sodium chloride, sodium chloride, lactated ringers, metoprolol tartrate, midazolam, morphine  injection, ondansetron (ZOFRAN) IV, oxyCODONE, sodium chloride flush, sodium chloride flush, traMADol, zolpidem  Xrays Dg Chest 2 View  Result Date: 01/09/2018 CLINICAL DATA:  Chest soreness after CABG EXAM: CHEST - 2 VIEW COMPARISON:  Portable chest x-ray of 01/08/2018 FINDINGS: Aeration of the lungs has improved. There are small pleural effusions present left greater than right with left basilar atelectasis noted as well. Cardiomegaly is stable. Median sternotomy sutures are noted from CABG. IMPRESSION: 1. Improved aeration with improving basilar atelectasis. 2. Persistent small effusions left-greater-than-right with persistent left basilar atelectasis. 3. Stable cardiomegaly. Electronically Signed   By: Dwyane Dee M.D.   On: 01/09/2018 09:33    Assessment/Plan: S/P Procedure(s) (LRB): CORONARY  ARTERY BYPASS GRAFTING (CABG) x three, using left internal mammary artery and right leg greater saphenous vein harvested endoscopically (N/A) TRANSESOPHAGEAL ECHOCARDIOGRAM (TEE) (N/A)  1 doing well 2 no new labs 3 + HTN, 1 short episode of vtach. Will increase betablocker and lisinopril dose 4 needs further diuresis with pitting edema- will add lasix/K+ 5 repeat BMET in am 6 sugars fair control, HgB a1c 6.1- needs diet management- will ask DM coordinator to see 7 wires out in am if no rhythm issues 8 probable home 1-2 days  LOS: 6 days    Rowe Clack 01/10/2018 Patient seen and examined, agree with above Increase beta blocker as noted  Viviann Spare C. Dorris Fetch, MD [facility] [phone]

## 2018-01-10 NOTE — Progress Notes (Addendum)
Pt has ambulated twice today for approximately 7 minutes(two laps around the unit) each time with front wheel walker.  Pt states he feels fine and is trying to increase ambulation time. Pt resting with call bell within reach.  Will continue to monitor.

## 2018-01-10 NOTE — Progress Notes (Signed)
CARDIAC REHAB PHASE I  [phone] Patient refused ambulation stating he had intentions to walk later today. D/C 1-2 days per MD note. Discharge education completed. Patient appears anxious after phone conversation with mother regarding housing after discharge. Patient states he does not have 24 hour care but does not need or want someone with him at all time. 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 He has intentions of staying in his room "where the air is clean". Patient is "renting" a recliner so that he can sleep. He states he is not comfortable lying down and feels more short of breath. Reviewed moving right along booklet with patient, diet modification including diabetic teaching. Patient stated he is not a diabetic and refused teaching. Activity progression given. Patient states he works 2 jobs and probably will not be able to attend pulmonary rehab however he stated he was OK with referral being placed and he would decide if it was something he could do after discharge. Phase 2 referral placed to [place].  Akhil Piscopo English PayneRN, BSN 01/10/2018 11:13 AM

## 2018-01-10 NOTE — Progress Notes (Signed)
Referral received for pre-diabetes.  Diabetes coordinator not on site today.  Placed outpatient pre-diabetes referral for after discharge education.  Will notify RN.  Thanks,   Beryl Meager, RN, BC-ADM Inpatient Diabetes Coordinator Pager [phone] (8a-5p)

## 2018-01-11 LAB — CBC
HCT: 29.4 % — ABNORMAL LOW (ref 39.0–52.0)
Hemoglobin: 9.9 g/dL — ABNORMAL LOW (ref 13.0–17.0)
MCH: 31.8 pg (ref 26.0–34.0)
MCHC: 33.7 g/dL (ref 30.0–36.0)
MCV: 94.5 fL (ref 78.0–100.0)
Platelets: 271 10*3/uL (ref 150–400)
RBC: 3.11 MIL/uL — ABNORMAL LOW (ref 4.22–5.81)
RDW: 13.2 % (ref 11.5–15.5)
WBC: 10 10*3/uL (ref 4.0–10.5)

## 2018-01-11 LAB — BASIC METABOLIC PANEL
ANION GAP: 8 (ref 5–15)
BUN: 5 mg/dL — ABNORMAL LOW (ref 6–20)
CHLORIDE: 102 mmol/L (ref 101–111)
CO2: 29 mmol/L (ref 22–32)
CREATININE: 1.06 mg/dL (ref 0.61–1.24)
Calcium: 8 mg/dL — ABNORMAL LOW (ref 8.9–10.3)
GFR calc non Af Amer: >60 mL/min (ref >60)
Glucose, Bld: 134 mg/dL — ABNORMAL HIGH (ref 65–99)
Potassium: 3.3 mmol/L — ABNORMAL LOW (ref 3.5–5.1)
Sodium: 139 mmol/L (ref 135–145)

## 2018-01-11 MED ORDER — SORBITOL 70 % SOLN
30.0000 mL | Freq: Every day | Status: DC | PRN
  Administered 2018-01-11: 30 mL via ORAL
  Filled 2018-01-11 (×2): qty 30

## 2018-01-11 MED ORDER — POTASSIUM CHLORIDE CRYS ER 20 MEQ PO TBCR
40.0000 meq | EXTENDED_RELEASE_TABLET | Freq: Two times a day (BID) | ORAL | Status: DC
  Administered 2018-01-11 – 2018-01-12 (×3): 40 meq via ORAL
  Filled 2018-01-11 (×3): qty 2

## 2018-01-11 MED ORDER — AMLODIPINE BESYLATE 5 MG PO TABS
5.0000 mg | ORAL_TABLET | Freq: Every day | ORAL | Status: DC
  Administered 2018-01-11 – 2018-01-12 (×2): 5 mg via ORAL
  Filled 2018-01-11 (×2): qty 1

## 2018-01-11 MED ORDER — POLYETHYLENE GLYCOL 3350 17 G PO PACK: 17.0000 g | PACK | Freq: Every day | ORAL | Status: DC | PRN

## 2018-01-11 NOTE — Progress Notes (Addendum)
[address]       [place] [address]             [phone]      5 Days Post-Op Procedure(s) (LRB): CORONARY ARTERY BYPASS GRAFTING (CABG) x three, using left internal mammary artery and right leg greater saphenous vein harvested endoscopically (N/A) TRANSESOPHAGEAL ECHOCARDIOGRAM (TEE) (N/A) Subjective: Feels pretty well overal  Objective: Vital signs in last 24 hours: Temp:  [98.4 F (36.9 C)-99.5 F (37.5 C)] 98.4 F (36.9 C) (05/05 0427) Pulse Rate:  [81-92] 92 (05/05 0427) Cardiac Rhythm: Normal sinus rhythm (05/05 0530) Resp:  [18-20] 20 (05/05 0427) BP: (149-154)/(61-71) 149/71 (05/05 0427) SpO2:  [99 %-100 %] 100 % (05/05 0427) Weight:  [94.4 kg (208 lb 1.6 oz)] 94.4 kg (208 lb 1.6 oz) (05/05 0427)  Hemodynamic parameters for last 24 hours:    Intake/Output from previous day: 05/04 0701 - 05/05 0700 In: 720 [P.O.:720] Out: -  Intake/Output this shift: No intake/output data recorded.  General appearance: alert, cooperative and no distress Heart: regular rate and rhythm Lungs: mildly dim in bases Abdomen: benign Extremities: + LE edema Wound: incis healing well  Lab Results: Recent Labs    01/09/18 0403 01/11/18 0250  WBC 16.6* 10.0  HGB 11.3* 9.9*  HCT 33.2* 29.4*  PLT 173 271   BMET:  Recent Labs    01/09/18 0403 01/11/18 0250  NA 139 139  K 4.0 3.3*  CL 102 102  CO2 29 29  GLUCOSE 128* 134*  BUN 9 5*  CREATININE 1.09 1.06  CALCIUM 8.5* 8.0*    PT/INR: No results for input(s): LABPROT, INR in the last 72 hours. ABG    Component Value Date/Time   PHART 7.352 01/06/2018 1727   HCO3 20.5 01/06/2018 1727   TCO2 23 01/07/2018 1625   ACIDBASEDEF 5.0 (H) 01/06/2018 1727   O2SAT 96.0 01/06/2018 1727   CBG (last 3)  Recent Labs    01/09/18 2153  GLUCAP 176*    Meds Scheduled Meds: . acetaminophen  1,000 mg Oral Q6H   Or  . acetaminophen (TYLENOL) oral liquid 160 mg/5 mL  1,000 mg Per Tube Q6H  . aspirin  EC  325 mg Oral Daily   Or  . aspirin  324 mg Per Tube Daily  . atorvastatin  80 mg Oral q1800  . bisacodyl  10 mg Oral Daily   Or  . bisacodyl  10 mg Rectal Daily  . docusate sodium  200 mg Oral Daily  . enoxaparin (LOVENOX) injection  40 mg Subcutaneous QHS  . furosemide  40 mg Oral Daily  . lisinopril  20 mg Oral Daily  . metoprolol tartrate  25 mg Oral BID   Or  . metoprolol tartrate  12.5 mg Per Tube BID  . moving right along book   Does not apply Once  . pantoprazole  40 mg Oral Daily  . potassium chloride  20 mEq Oral Daily  . sodium chloride flush  3 mL Intravenous Q12H  . sodium chloride flush  3 mL Intravenous Q12H   Continuous Infusions: . sodium chloride Stopped (01/07/18 1100)  . sodium chloride Stopped (01/08/18 0400)  . sodium chloride Stopped (01/07/18 0900)  . sodium chloride    . dexmedetomidine (PRECEDEX) IV infusion Stopped (01/07/18 0600)  . lactated ringers    . lactated ringers    . lactated ringers Stopped (01/07/18 0900)   PRN Meds:.sodium chloride, sodium chloride, lactated ringers, metoprolol tartrate,  midazolam, morphine injection, ondansetron (ZOFRAN) IV, oxyCODONE, sodium chloride flush, sodium chloride flush, traMADol, zolpidem  Xrays Dg Chest 2 View  Result Date: 01/09/2018 CLINICAL DATA:  Chest soreness after CABG EXAM: CHEST - 2 VIEW COMPARISON:  Portable chest x-ray of 01/08/2018 FINDINGS: Aeration of the lungs has improved. There are small pleural effusions present left greater than right with left basilar atelectasis noted as well. Cardiomegaly is stable. Median sternotomy sutures are noted from CABG. IMPRESSION: 1. Improved aeration with improving basilar atelectasis. 2. Persistent small effusions left-greater-than-right with persistent left basilar atelectasis. 3. Stable cardiomegaly. Electronically Signed   By: Dwyane Dee M.D.   On: 01/09/2018 09:33    Assessment/Plan: S/P Procedure(s) (LRB): CORONARY ARTERY BYPASS GRAFTING (CABG) x  three, using left internal mammary artery and right leg greater saphenous vein harvested endoscopically (N/A) TRANSESOPHAGEAL ECHOCARDIOGRAM (TEE) (N/A)  1 conts to do well 2 remains hypertensive, I think he would benefit from other agent instead of increasing lisinopril dose . Will add norvasc 3 had good discussion about pre-diabetes and nutrition- he is motivated to control with diet- diabetes coordinator has arranged outpatient teaching 4 wires to come out today. In sinus with no further significant ectopy- will replace K+ 5 cont to diurese 6 routine pulm toilet/rehab 7 he has arrangements to help him at discharge tomorrow     LOS: 7 days    Rowe Clack 01/11/2018 Patient seen and examined, agree with above  Viviann Spare C. Dorris Fetch, MD [facility] [phone]

## 2018-01-11 NOTE — Progress Notes (Signed)
EPW removed per order clean and intact. VSS. Pt educated on 1 hour bed rest. Call light in reach. Will continue to monitor.  Versie Starks, RN

## 2018-01-12 LAB — BASIC METABOLIC PANEL
ANION GAP: 8 (ref 5–15)
BUN: 6 mg/dL (ref 6–20)
CO2: 27 mmol/L (ref 22–32)
Calcium: 8.3 mg/dL — ABNORMAL LOW (ref 8.9–10.3)
Chloride: 101 mmol/L (ref 101–111)
Creatinine, Ser: 1.04 mg/dL (ref 0.61–1.24)
GFR calc Af Amer: >60 mL/min (ref >60)
GLUCOSE: 143 mg/dL — AB (ref 65–99)
Potassium: 3.8 mmol/L (ref 3.5–5.1)
Sodium: 136 mmol/L (ref 135–145)

## 2018-01-12 MED ORDER — ACETAMINOPHEN 325 MG PO TABS: 650.0000 mg | ORAL_TABLET | Freq: Four times a day (QID) | ORAL | Status: AC | PRN

## 2018-01-12 MED ORDER — AMLODIPINE BESYLATE 5 MG PO TABS: 5.0000 mg | ORAL_TABLET | Freq: Every day | ORAL | 3 refills | Status: DC

## 2018-01-12 MED ORDER — LISINOPRIL 10 MG PO TABS: 10.0000 mg | ORAL_TABLET | Freq: Every day | ORAL | 11 refills | Status: DC

## 2018-01-12 MED ORDER — ATORVASTATIN CALCIUM 80 MG PO TABS: 80.0000 mg | ORAL_TABLET | Freq: Every day | ORAL | 11 refills | Status: DC

## 2018-01-12 MED ORDER — FUROSEMIDE 40 MG PO TABS: 40.0000 mg | ORAL_TABLET | Freq: Every day | ORAL | 0 refills | Status: DC

## 2018-01-12 MED ORDER — ASPIRIN 325 MG PO TBEC: 325.0000 mg | DELAYED_RELEASE_TABLET | Freq: Every day | ORAL | 0 refills | Status: DC

## 2018-01-12 MED ORDER — METOPROLOL TARTRATE 25 MG PO TABS: 25.0000 mg | ORAL_TABLET | Freq: Two times a day (BID) | ORAL | 3 refills | Status: DC

## 2018-01-12 MED ORDER — POTASSIUM CHLORIDE CRYS ER 20 MEQ PO TBCR: 40.0000 meq | EXTENDED_RELEASE_TABLET | Freq: Two times a day (BID) | ORAL | 0 refills | Status: DC

## 2018-01-12 MED ORDER — OXYCODONE HCL 5 MG PO TABS: 5.0000 mg | ORAL_TABLET | ORAL | 0 refills | Status: DC | PRN

## 2018-01-12 NOTE — Progress Notes (Signed)
D/c instructions given to patient. Prescriptions given. Chest tube sutures removed. Telemetry and IV removed, clean and intact. Brother to escort pt home.  Versie Starks, RN

## 2018-01-12 NOTE — Care Management Note (Signed)
Case Management Note Previous CM note completed by Lawerance Sabal, RN 01/09/2018, 11:12 AM   Patient Details  Name: QUENTEN NAWAZ MRN: [id number] Date of Birth: July 22, 1956  Subjective/Objective:                 Spoke to patient at the bedside. He states that prior to admission he did not go to the doctor, and he did not have any Rx's. He states he spoke w Dr. Tyrone Sage who agrees to follow him after DC. He states he has [employer] and he can choose any PCP, he does not want to use PCP through [place]. He states he also has medical insurance through [facility] [employer] [facility] [employer] where he works (this is not listed on facesheet). He states he plans on using commercial pharmacy, after DC. Current medications are low cost, $4 list at [facility].  He states he will have no difficulties paying out of pocket if he needs to. Medication Rx coverage being verified by CMAs. Will update w results.  [facility] so he could fill them there with no cost. Patient states he will get them at [facility] regardless and use his Good Rx coupon/ app/ card. No further CM needs.   Action/Plan:   Expected Discharge Date:  01/12/18               Expected Discharge Plan:  Home/Self Care  In-House Referral:     Discharge planning Services  CM Consult  Post Acute Care Choice:  Durable Medical Equipment Choice offered to:  Patient  DME Arranged:  Dan Humphreys rolling DME Agency:  [facility]  HH Arranged:  NA HH Agency:  NA  Status of Service:  Completed, signed off  If discussed at Long Length of Stay Meetings, dates discussed:    Discharge Disposition: home/self care   Additional Comments:  01/12/18- 1000- Tylek Boney Vath RN, CM- pt for d/c home today- order placed for RW- call med to [place] with [facility]- for DME need- RW to be delivered to room prior to discharge- pt to pick up meds either at [facility] or [place]- per pt choice. No other CM needs noted for transition to home.   Infant, Zink Crouse,  RN 01/12/2018, 10:12 AM [phone] 4E Transition Care Coordinator

## 2018-01-12 NOTE — Progress Notes (Signed)
      [address]       [place] [address]             [phone]      6 Days Post-Op Procedure(s) (LRB): CORONARY ARTERY BYPASS GRAFTING (CABG) x three, using left internal mammary artery and right leg greater saphenous vein harvested endoscopically (N/A) TRANSESOPHAGEAL ECHOCARDIOGRAM (TEE) (N/A)   Subjective:  Cameron Camacho has no new complaints this morning.  He is feeling well and is happy to go home.  All questions were answered.  + ambulation  +BM  Objective: Vital signs in last 24 hours: Temp:  [98.4 F (36.9 C)-99.2 F (37.3 C)] 99.2 F (37.3 C) (05/06 0457) Pulse Rate:  [76-105] 86 (05/06 0457) Cardiac Rhythm: Normal sinus rhythm (05/06 0457) Resp:  [21-28] 24 (05/06 0457) BP: (105-166)/(70-93) 134/70 (05/06 0457) SpO2:  [97 %-98 %] 98 % (05/06 0457) Weight:  [204 lb 11.2 oz (92.9 kg)] 204 lb 11.2 oz (92.9 kg) (05/06 0457)  Intake/Output from previous day: 05/05 0701 - 05/06 0700 In: 600 [P.O.:600] Out: 3425 [Urine:3425]  General appearance: alert, cooperative and no distress Heart: regular rate and rhythm Lungs: clear to auscultation bilaterally Abdomen: soft, non-tender; bowel sounds normal; no masses,  no organomegaly Extremities: edema trace Wound: clean and dry  Lab Results: Recent Labs    01/11/18 0250  WBC 10.0  HGB 9.9*  HCT 29.4*  PLT 271   BMET:  Recent Labs    01/11/18 0250  NA 139  K 3.3*  CL 102  CO2 29  GLUCOSE 134*  BUN 5*  CREATININE 1.06  CALCIUM 8.0*    PT/INR: No results for input(s): LABPROT, INR in the last 72 hours. ABG    Component Value Date/Time   PHART 7.352 01/06/2018 1727   HCO3 20.5 01/06/2018 1727   TCO2 23 01/07/2018 1625   ACIDBASEDEF 5.0 (H) 01/06/2018 1727   O2SAT 96.0 01/06/2018 1727   CBG (last 3)  Recent Labs    01/09/18 2153  GLUCAP 176*    Assessment/Plan: S/P Procedure(s) (LRB): CORONARY ARTERY BYPASS GRAFTING (CABG) x three, using left internal mammary artery and right  leg greater saphenous vein harvested endoscopically (N/A) TRANSESOPHAGEAL ECHOCARDIOGRAM (TEE) (N/A)  1. CV- NSR, BP well controlled- continue Lopressor, Norvasc, Lisinopril 2. Pulm- no acute issues, continue IS 3. Renal- creatinine stable, weight is trending down, K is at 3.3, continue supplementation 4. Prediabetic, continue diet control 5. Dispo- patient stable, will d/c home today   LOS: 8 days    Lowella Dandy 01/12/2018

## 2018-01-19 ENCOUNTER — Telehealth (HOSPITAL_COMMUNITY): Payer: Self-pay

## 2018-01-19 NOTE — Telephone Encounter (Signed)
Referral received. Called patient in regards to Cardiac Rehab and insurance. Patient stated he is interested in the Cardiac Rehab Program. He will be participating in the program through the [place]. Explained to patient [facility]. Patient verbalized understanding.

## 2018-01-28 ENCOUNTER — Ambulatory Visit (INDEPENDENT_AMBULATORY_CARE_PROVIDER_SITE_OTHER): Payer: Non-veteran care | Admitting: Physician Assistant

## 2018-01-28 ENCOUNTER — Telehealth: Payer: Self-pay | Admitting: *Deleted

## 2018-01-28 ENCOUNTER — Encounter: Payer: Self-pay | Admitting: Physician Assistant

## 2018-01-28 VITALS — BP 120/80 | HR 68 | Ht 70.0 in | Wt 200.0 lb

## 2018-01-28 DIAGNOSIS — Z951 Presence of aortocoronary bypass graft: Secondary | ICD-10-CM | POA: Diagnosis not present

## 2018-01-28 DIAGNOSIS — R7303 Prediabetes: Secondary | ICD-10-CM | POA: Diagnosis not present

## 2018-01-28 DIAGNOSIS — I10 Essential (primary) hypertension: Secondary | ICD-10-CM

## 2018-01-28 DIAGNOSIS — E785 Hyperlipidemia, unspecified: Secondary | ICD-10-CM | POA: Diagnosis not present

## 2018-01-28 MED ORDER — ASPIRIN EC 81 MG PO TBEC: 81.0000 mg | DELAYED_RELEASE_TABLET | Freq: Every day | ORAL | 0 refills | Status: DC

## 2018-01-28 MED ORDER — CLOPIDOGREL BISULFATE 75 MG PO TABS: 75.0000 mg | ORAL_TABLET | Freq: Every day | ORAL | 3 refills | Status: DC

## 2018-01-28 NOTE — Addendum Note (Signed)
Addended by: Burnetta Sabin on: 01/28/2018 11:51 AM   Modules accepted: Orders

## 2018-01-28 NOTE — Progress Notes (Signed)
Cardiology Office Note    Date:  01/28/2018   ID:  Cameron Camacho, DOB 1956/03/08, MRN [id number]  PCP:  [facility]  Cardiologist: Fransico Him, MD  Chief Complaint  Patient presents with  . Hospitalization Follow-up    History of Present Illness:  Cameron Camacho is a 62 y.o. male patient who was admitted to [facility] with NSTEMI and found to have critical distal left main and multivessel disease and underwent CABG times 3, 01/06/18 LIMA to the LAD, SVG to OM, SVG to RCA.  Patient was seen by Dr. Burt Knack before discharge who recommended Plavix.  He was also started on ACE inhibitor for hypertension.  Patient comes in today for follow-up.  He was never placed on Plavix.  He is walking 2 miles per day and going to start cardiac rehab soon.  He works as an Agricultural consultant at [employer] and hopes to start back soon.  He is using cannabis and Tylenol for pain control.  Overall he feels well without cardiac complaints.  Ching his sugars closely.    Past Medical History:  Diagnosis Date  . Hyperlipidemia   . Hypertension   . Prediabetes     Past Surgical History:  Procedure Laterality Date  . CORONARY ARTERY BYPASS GRAFT N/A 01/06/2018   Procedure: CORONARY ARTERY BYPASS GRAFTING (CABG) x three, using left internal mammary artery and right leg greater saphenous vein harvested endoscopically;  Surgeon: Grace Isaac, MD;  Location: [facility];  Service: Open Heart Surgery;  Laterality: N/A;  . LEFT HEART CATH AND CORONARY ANGIOGRAPHY N/A 01/05/2018   Procedure: LEFT HEART CATH AND CORONARY ANGIOGRAPHY;  Surgeon: Troy Sine, MD;  Location: [facility];  Service: Cardiovascular;  Laterality: N/A;  . TEE WITHOUT CARDIOVERSION N/A 01/06/2018   Procedure: TRANSESOPHAGEAL ECHOCARDIOGRAM (TEE);  Surgeon: Grace Isaac, MD;  Location: [facility];  Service: Open Heart Surgery;  Laterality: N/A;    Current Medications: Current Meds  Medication Sig  . acetaminophen (TYLENOL)  325 MG tablet Take 2 tablets (650 mg total) by mouth every 6 (six) hours as needed for mild pain or headache.  Marland Kitchen amLODipine (NORVASC) 5 MG tablet Take 1 tablet (5 mg total) by mouth daily.  . APPLE CIDER VINEGAR PO Take 15 mLs by mouth daily. Mix in 8 oz water and drink  . aspirin EC 325 MG EC tablet Take 1 tablet (325 mg total) by mouth daily.  Marland Kitchen atorvastatin (LIPITOR) 80 MG tablet Take 1 tablet (80 mg total) by mouth daily at 6 PM.  . lisinopril (PRINIVIL,ZESTRIL) 10 MG tablet Take 1 tablet (10 mg total) by mouth daily.  . metoprolol tartrate (LOPRESSOR) 25 MG tablet Take 1 tablet (25 mg total) by mouth 2 (two) times daily.  . Naphazoline HCl (CLEAR EYES OP) Place 1 drop into both eyes daily as needed (tired eyes).  Marland Kitchen oxymetazoline (AFRIN) 0.05 % nasal spray Place 1 spray into both nostrils 2 (two) times daily.     Allergies:   Patient has no known allergies.   Social History   Socioeconomic History  . Marital status: Married    Spouse name: Not on file  . Number of children: Not on file  . Years of education: Not on file  . Highest education level: Not on file  Occupational History  . Not on file  Social Needs  . Financial resource strain: Not on file  . Food insecurity:    Worry: Not on file  Inability: Not on file  . Transportation needs:    Medical: Not on file    Non-medical: Not on file  Tobacco Use  . Smoking status: Passive Smoke Exposure - Never Smoker  . Smokeless tobacco: Never Used  . Tobacco comment: Smokes cannabis for pain control  Substance and Sexual Activity  . Alcohol use: Not Currently    Frequency: Never  . Drug use: Yes    Types: Marijuana    Comment: Occasional THC for pain  . Sexual activity: Not on file  Lifestyle  . Physical activity:    Days per week: Not on file    Minutes per session: Not on file  . Stress: Not on file  Relationships  . Social connections:    Talks on phone: Not on file    Gets together: Not on file    Attends  religious service: Not on file    Active member of club or organization: Not on file    Attends meetings of clubs or organizations: Not on file    Relationship status: Not on file  Other Topics Concern  . Not on file  Social History Narrative  . Not on file     Family History:  The patient's family history includes CAD in his father; Hypertension in his brother and mother.   ROS:   Please see the history of present illness.    Review of Systems  Constitution: Negative.  HENT: Negative.   Cardiovascular: Negative.   Respiratory: Negative.   Endocrine: Negative.   Hematologic/Lymphatic: Negative.   Musculoskeletal: Negative.   Gastrointestinal: Negative.   Genitourinary: Negative.   Neurological: Negative.    All other systems reviewed and are negative.   PHYSICAL EXAM:   VS:  BP 120/80   Pulse 68   Ht _0  (1.778 m)   Wt 200 lb (90.7 kg)   BMI 28.70 kg/m   Physical Exam  GEN: Well nourished, well developed, in no acute distress  Neck: no JVD, carotid bruits, or masses Cardiac: Incision healing well RRR; no murmurs, rubs, or gallops  Respiratory:  clear to auscultation bilaterally, normal work of breathing GI: soft, nontender, nondistended, + BS Ext: Right arm at cath site without hematoma or hemorrhage good radial brachial pulses, lower extremities without cyanosis, clubbing, or edema, Good distal pulses bilaterally Neuro:  Alert and Oriented x 3 Psych: euthymic mood, full affect  Wt Readings from Last 3 Encounters:  01/28/18 200 lb (90.7 kg)  01/12/18 204 lb 11.2 oz (92.9 kg)      Studies/Labs Reviewed:   EKG:  EKG is not ordered today.   Recent Labs: 01/03/2018: B Natriuretic Peptide 102.8; TSH 1.405 01/05/2018: ALT 13 01/07/2018: Magnesium 2.1 01/11/2018: Hemoglobin 9.9; Platelets 271 01/12/2018: BUN 6; Creatinine, Ser 1.04; Potassium 3.8; Sodium 136   Lipid Panel    Component Value Date/Time   CHOL 169 01/04/2018 0513   TRIG 51 01/04/2018 0513   HDL 56  01/04/2018 0513   CHOLHDL 3.0 01/04/2018 0513   VLDL 10 01/04/2018 0513   LDLCALC 103 (H) 01/04/2018 0513    Additional studies/ records that were reviewed today include:  : angiography:     Dist LM to Ost LAD lesion is 95% stenosed.  Mid LM to Dist LM lesion is 90% stenosed.  Ost Cx to Prox Cx lesion is 70% stenosed.  Prox RCA lesion is 20% stenosed.  Mid RCA lesion is 70% stenosed.  Dist RCA lesion is 50% stenosed.  Ost RPDA lesion  is 60% stenosed.  RPDA lesion is 55% stenosed.   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   Treatments: surgery:   2D echo 4/29/2019Study Conclusions   - Left ventricle: The cavity size was normal. Wall thickness was   increased in a pattern of mild LVH. Systolic function was   vigorous. The estimated ejection fraction was in the range of 65%   to 70%. Wall motion was normal; there were no regional wall   motion abnormalities. Doppler parameters are consistent with   abnormal left ventricular relaxation (grade 1 diastolic   dysfunction). - Aortic valve: Trileaflet; mildly thickened, moderately calcified   leaflets (left cusp). - Mitral valve: Mildly thickened leaflets . There was trivial   regurgitation.  TEE in OR 01/06/2018 Result status: Final result   Aortic valve: The valve is trileaflet. Mild valve thickening present. Mild valve calcification present. No stenosis. No regurgitation.  Mitral valve: Mild leaflet thickening is present.  Right ventricle: Normal cavity size, wall thickness and ejection fraction.       ASSESSMENT:    1. S/P CABG x 3   2. Dyslipidemia, goal LDL below 70   3. Essential hypertension   4. Prediabetes      PLAN:  In order of problems listed above:  CAD status post NSTEMI followed by CABG  times 3, 01/06/18-Walking 2 miles/day.  Plavix was recommended in the hospital but not started.  Dr. Burt Knack recommended Plavix for 1 year.  Will start today  75 mg once daily with food.  Dyslipidemia currently on Lipitor.  Check fasting lipid panel and LFTs in 6 weeks as well as be met  Essential hypertension blood pressure well controlled on low-dose lisinopril follow-up labs in 6 weeks renal function normal 01/12/2018  Prediabetes  hemoglobin A1c of 6.0     Medication Adjustments/Labs and Tests Ordered: Current medicines are reviewed at length with the patient today.  Concerns regarding medicines are outlined above.  Medication changes, Labs and Tests ordered today are listed in the Patient Instructions below. Patient Instructions  Medication Instructions:  Your physician has recommended you make the following change in your medication:  1.  START Plavix 75 mg taking 1 tablet daily with food   Labwork: 6 WEEKS FASTING:  BMET, LIPID, & LFT   Testing/Procedures: None ordered  Follow-Up: Your physician recommends that you schedule a follow-up appointment in: 2 MONTHS WITH DR. Radford Pax   Any Other Special Instructions Will Be Listed Below (If Applicable).     If you need a refill on your cardiac medications before your next appointment, please call your pharmacy.      Sumner Boast, PA-C  01/28/2018 11:27 AM    [facility] [address], [place], [place]  [address] Phone: [phone]; Fax: [phone]

## 2018-01-28 NOTE — Telephone Encounter (Signed)
Left message for pt to return my call. Jacolyn Reedy, PA-C, would like for pt to reduce the Aspirin to 81 mg.

## 2018-01-28 NOTE — Patient Instructions (Addendum)
Medication Instructions:  Your physician has recommended you make the following change in your medication:  1.  START Plavix 75 mg taking 1 tablet daily with food 2.  REDUCE the Aspirin to 81 mg daily   Labwork: 6 WEEKS FASTING:  BMET, LIPID, & LFT   Testing/Procedures: None ordered  Follow-Up: Your physician recommends that you schedule a follow-up appointment in: 2 MONTHS WITH DR. Mayford Knife   Any Other Special Instructions Will Be Listed Below (If Applicable).     If you need a refill on your cardiac medications before your next appointment, please call your pharmacy.

## 2018-02-04 MED ORDER — ASPIRIN EC 81 MG PO TBEC: 81.0000 mg | DELAYED_RELEASE_TABLET | Freq: Every day | ORAL | 3 refills | Status: AC

## 2018-02-04 NOTE — Telephone Encounter (Signed)
Pt has been made aware to reduce his Aspirin to 81 mg daily.

## 2018-02-11 ENCOUNTER — Other Ambulatory Visit: Payer: Self-pay | Admitting: Cardiothoracic Surgery

## 2018-02-11 DIAGNOSIS — Z951 Presence of aortocoronary bypass graft: Secondary | ICD-10-CM

## 2018-02-12 ENCOUNTER — Ambulatory Visit (INDEPENDENT_AMBULATORY_CARE_PROVIDER_SITE_OTHER): Payer: Self-pay | Admitting: Cardiothoracic Surgery

## 2018-02-12 ENCOUNTER — Other Ambulatory Visit: Payer: Self-pay

## 2018-02-12 ENCOUNTER — Encounter: Payer: Self-pay | Admitting: Cardiothoracic Surgery

## 2018-02-12 ENCOUNTER — Ambulatory Visit
Admission: RE | Admit: 2018-02-12 | Discharge: 2018-02-12 | Disposition: A | Discharge status: Home / Self Care | Payer: Non-veteran care | Source: Ambulatory Visit | Attending: Cardiothoracic Surgery | Admitting: Cardiothoracic Surgery

## 2018-02-12 VITALS — BP 130/78 | HR 69 | Resp 18 | Ht 70.0 in | Wt 203.8 lb

## 2018-02-12 DIAGNOSIS — Z951 Presence of aortocoronary bypass graft: Secondary | ICD-10-CM

## 2018-02-12 NOTE — Progress Notes (Signed)
[address]       [place] [address]             [phone]      Cameron Camacho [facility] Medical Record #[id number] Date of Birth: 06-13-1956  Referring: Quintella Reichert, MD Primary Care: Dyann Kief, PA-C Primary Cardiologist: Armanda Magic, MD   Chief Complaint:   POST OP FOLLOW UP DATE OF OPERATION:  01/06/2018  PREOPERATIVE DIAGNOSIS:  Unstable angina with critical left main obstruction.  POSTOPERATIVE DIAGNOSES:  Unstable angina with critical left main obstruction.  SURGICAL PROCEDURE:  Coronary artery bypass grafting x3 with the left internal mammary to the left anterior descending coronary artery, reverse saphenous vein graft to the circumflex coronary artery, reverse saphenous vein graft to the distal right  coronary artery with right thigh greater saphenous endoscopic vein harvesting.  SURGEON:  Sheliah Plane, MD   History of Present Illness:     Patient returns office today in follow-up after recent coronary artery bypass grafting x3 done urgently for critical left main obstruction.  Initially the patient was very hesitant about proceeding but finally agreed.  He made good progress postoperatively. He is now walking 2 miles a day. He will start in cardiac rehab next week Cardiology has started him on Plavix   Past Medical History:  Diagnosis Date  . Hyperlipidemia   . Hypertension   . Prediabetes      Social History   Tobacco Use  Smoking Status Passive Smoke Exposure - Never Smoker  Smokeless Tobacco Never Used  Tobacco Comment   Smokes cannabis for pain control    Social History   Substance and Sexual Activity  Alcohol Use Not Currently  . Frequency: Never     No Known Allergies  Current Outpatient Medications  Medication Sig Dispense Refill  . acetaminophen (TYLENOL) 325 MG tablet Take 2 tablets (650 mg total) by mouth every 6 (six) hours as needed for mild pain or headache.    Marland Kitchen amLODipine (NORVASC) 5  MG tablet Take 1 tablet (5 mg total) by mouth daily. 30 tablet 3  . APPLE CIDER VINEGAR PO Take 15 mLs by mouth daily. Mix in 8 oz water and drink    . aspirin EC 81 MG tablet Take 1 tablet (81 mg total) by mouth daily. 90 tablet 3  . atorvastatin (LIPITOR) 80 MG tablet Take 1 tablet (80 mg total) by mouth daily at 6 PM. 30 tablet 11  . clopidogrel (PLAVIX) 75 MG tablet Take 1 tablet (75 mg total) by mouth daily. 90 tablet 3  . furosemide (LASIX) 40 MG tablet Take 1 tablet (40 mg total) by mouth daily. 7 tablet 0  . lisinopril (PRINIVIL,ZESTRIL) 10 MG tablet Take 1 tablet (10 mg total) by mouth daily. 30 tablet 11  . metoprolol tartrate (LOPRESSOR) 25 MG tablet Take 1 tablet (25 mg total) by mouth 2 (two) times daily. 60 tablet 3  . Naphazoline HCl (CLEAR EYES OP) Place 1 drop into both eyes daily as needed (tired eyes).    Marland Kitchen oxymetazoline (AFRIN) 0.05 % nasal spray Place 1 spray into both nostrils 2 (two) times daily.    . potassium chloride SA (K-DUR,KLOR-CON) 20 MEQ tablet Take 2 tablets (40 mEq total) by mouth 2 (two) times daily. For 2 days, then decrease to 40 mg daily 14 tablet 0   No current facility-administered medications for this visit.        Physical Exam: BP 130/78 (BP  Location: Left Arm, Patient Position: Sitting, Cuff Size: Normal)   Pulse 69   Resp 18   Ht 5\' 10"  (1.778 m)   Wt 203 lb 12.8 oz (92.4 kg)   SpO2 98% Comment: RA  BMI 29.24 kg/m   General appearance: alert, cooperative and no distress Neurologic: intact Heart: regular rate and rhythm, S1, S2 normal, no murmur, click, rub or gallop Lungs: clear to auscultation bilaterally Abdomen: soft, non-tender; bowel sounds normal; no masses,  no organomegaly Extremities: extremities normal, atraumatic, no cyanosis or edema and Homans sign is negative, no sign of DVT Wound: Sternum is stable and healing well   Diagnostic Studies & Laboratory data:     Recent Radiology Findings:   Dg Chest 2 View  Result Date:  02/12/2018 CLINICAL DATA:  Shortness of breath. Recent coronary artery bypass grafting EXAM: CHEST - 2 VIEW COMPARISON:  Jan 09, 2018 FINDINGS: There is a small left pleural effusion. There is patchy bibasilar atelectasis, somewhat more on the left than on the right. Lungs elsewhere are clear. Heart size and pulmonary vascularity are normal. Patient is status post coronary artery bypass grafting. No adenopathy. No evident bone lesions. No pneumothorax. IMPRESSION: Bibasilar atelectasis, somewhat more on the left than on the right, similar to prior study. Small left pleural effusion. No new opacity. Stable cardiac silhouette. Electronically Signed   By: Bretta BangWilliam  Woodruff III M.D.   On: 02/12/2018 14:18    I have independently reviewed the above radiology studies  and reviewed the findings with the patient.   Recent Lab Findings: Lab Results  Component Value Date   WBC 10.0 01/11/2018   HGB 9.9 (L) 01/11/2018   HCT 29.4 (L) 01/11/2018   PLT 271 01/11/2018   GLUCOSE 143 (H) 01/12/2018   CHOL 169 01/04/2018   TRIG 51 01/04/2018   HDL 56 01/04/2018   LDLCALC 103 (H) 01/04/2018   ALT 13 (L) 01/05/2018   AST 16 01/05/2018   NA 136 01/12/2018   K 3.8 01/12/2018   CL 101 01/12/2018   CREATININE 1.04 01/12/2018   BUN 6 01/12/2018   CO2 27 01/12/2018   TSH 1.405 01/03/2018   INR 1.31 01/06/2018   HGBA1C 6.0 (H) 01/05/2018      Assessment / Plan:      Patient doing well postoperatively To start cardiac rehab this week Plan to return to work July 8 Plan to see back in 6 weeks     Delight OvensEdward B Hiilei Gerst MD      [address] [place] [id number] Office [phone]   Beeper [phone]  02/12/2018 2:38 PM

## 2018-02-17 ENCOUNTER — Telehealth: Payer: Self-pay

## 2018-02-17 NOTE — Telephone Encounter (Signed)
[name] expressed concerns about taking his medications at length via telephone.  He stated that a week ago he experienced facial swelling that resolved on it's own.  Then a couple days later he experienced groin swelling.  He stated that he was taking medications for constipation which he feels did not help the matter.  He stated that he took himself off some medications believing that they may have been the cause for the swelling.  I advised him to contact his Cardiologist right away to find out which medication, all of which are newly prescribed since discharge from the hospital after having CABG x3 01/06/2018.  He acknowledged receipt and stated that he would.

## 2018-03-16 ENCOUNTER — Other Ambulatory Visit: Payer: Self-pay

## 2018-03-18 ENCOUNTER — Telehealth (HOSPITAL_COMMUNITY): Payer: Self-pay

## 2018-03-18 NOTE — Telephone Encounter (Signed)
Attempted to follow up with patient in regards to cardiac rehab - could not leave voicemail. Will send letter.

## 2018-03-25 ENCOUNTER — Telehealth (HOSPITAL_COMMUNITY): Payer: Self-pay

## 2018-03-25 ENCOUNTER — Other Ambulatory Visit: Payer: Self-pay | Admitting: Cardiothoracic Surgery

## 2018-03-25 DIAGNOSIS — Z951 Presence of aortocoronary bypass graft: Secondary | ICD-10-CM

## 2018-03-25 NOTE — Telephone Encounter (Signed)
Called patient to follow up in regards to Cardiac Rehab - Patient stated he has been [facility] with [facility] and is waiting on approval to do Cardiac Rehab which is why we have not received Authorization. Patient stated he does return back to work next week as he has already been out for 12 weeks but is still interested in participating.

## 2018-03-26 ENCOUNTER — Ambulatory Visit
Admission: RE | Admit: 2018-03-26 | Discharge: 2018-03-26 | Disposition: A | Discharge status: Home / Self Care | Payer: No Typology Code available for payment source | Source: Ambulatory Visit | Attending: Cardiothoracic Surgery | Admitting: Cardiothoracic Surgery

## 2018-03-26 ENCOUNTER — Ambulatory Visit (INDEPENDENT_AMBULATORY_CARE_PROVIDER_SITE_OTHER): Payer: Self-pay | Admitting: Cardiothoracic Surgery

## 2018-03-26 ENCOUNTER — Encounter: Payer: Self-pay | Admitting: Cardiothoracic Surgery

## 2018-03-26 ENCOUNTER — Encounter: Payer: Non-veteran care | Admitting: Cardiothoracic Surgery

## 2018-03-26 VITALS — BP 159/88 | HR 78 | Resp 20 | Ht 70.0 in | Wt 202.0 lb

## 2018-03-26 DIAGNOSIS — Z951 Presence of aortocoronary bypass graft: Secondary | ICD-10-CM

## 2018-03-26 DIAGNOSIS — I2510 Atherosclerotic heart disease of native coronary artery without angina pectoris: Secondary | ICD-10-CM

## 2018-03-26 NOTE — Progress Notes (Signed)
[address]       [place] [id number]             [phone]      Cameron AbbottDavid D Camacho [facility] Medical Record #[id number] Date of Birth: 07/27/1956  Referring: [name], [name], PA-C Primary Care: No primary care provider on file. Primary Cardiologist: [name], MD   Chief Complaint:   POST OP FOLLOW UP DATE OF OPERATION:  01/06/2018 PREOPERATIVE DIAGNOSIS:  Unstable angina with critical left main obstruction. POSTOPERATIVE DIAGNOSES:  Unstable angina with critical left main obstruction. SURGICAL PROCEDURE:  Coronary artery bypass grafting x3 with the left internal mammary to the left anterior descending coronary artery, reverse saphenous vein graft to the circumflex coronary artery, reverse saphenous vein graft to the distal right  coronary artery with right thigh greater saphenous endoscopic vein harvesting. SURGEON:  [name], MD   History of Present Illness:     Patient returns office today after coronary artery bypass grafting done in late April.  The patient is doing well without evidence of congestive heart failure or recurrent angina.  He comes into the office today very confused about his medications.  He was started on Plavix and his last cardiology visit but notes that he has not been taking it.  He says that his Lipitor is too high dose and wants to decrease it.  He had swelling of his lips from his lisinopril and quit taking it.       Past Medical History:  Diagnosis Date  . Hyperlipidemia   . Hypertension   . Prediabetes      Social History   Tobacco Use  Smoking Status Passive Smoke Exposure - Never Smoker  Smokeless Tobacco Never Used  Tobacco Comment   Smokes cannabis for pain control    Social History   Substance and Sexual Activity  Alcohol Use Not Currently  . Frequency: Never     Allergies  Allergen Reactions  . Lisinopril Hives and Swelling    Current Outpatient Medications  Medication Sig Dispense  Refill  . acetaminophen (TYLENOL) 325 MG tablet Take 2 tablets (650 mg total) by mouth every 6 (six) hours as needed for mild pain or headache.    Marland Kitchen. amLODipine (NORVASC) 5 MG tablet Take 1 tablet (5 mg total) by mouth daily. 30 tablet 3  . APPLE CIDER VINEGAR PO Take 15 mLs by mouth daily. Mix in 8 oz water and drink    . aspirin EC 81 MG tablet Take 1 tablet (81 mg total) by mouth daily. 90 tablet 3  . metoprolol tartrate (LOPRESSOR) 25 MG tablet Take 1 tablet (25 mg total) by mouth 2 (two) times daily. 60 tablet 3  . Naphazoline HCl (CLEAR EYES OP) Place 1 drop into both eyes daily as needed (tired eyes).    Marland Kitchen. oxymetazoline (AFRIN) 0.05 % nasal spray Place 1 spray into both nostrils 2 (two) times daily.    Marland Kitchen. atorvastatin (LIPITOR) 80 MG tablet Take 1 tablet (80 mg total) by mouth daily at 6 PM. (Patient not taking: Reported on 03/26/2018) 30 tablet 11  . clopidogrel (PLAVIX) 75 MG tablet Take 1 tablet (75 mg total) by mouth daily. (Patient not taking: Reported on 03/26/2018) 90 tablet 3  . furosemide (LASIX) 40 MG tablet Take 1 tablet (40 mg total) by mouth daily. (Patient not taking: Reported on 03/26/2018) 7 tablet 0   No current facility-administered medications for this visit.  Physical Exam: BP (!) 159/88   Pulse 78   Resp 20   Ht 5\' 10"  (1.778 m)   Wt 202 lb (91.6 kg)   SpO2 99% Comment: RA  BMI 28.98 kg/m  General appearance: alert and cooperative Head: Normocephalic, without obvious abnormality, atraumatic Neck: no adenopathy, no carotid bruit, no JVD, supple, symmetrical, trachea midline and thyroid not enlarged, symmetric, no tenderness/mass/nodules Lymph nodes: Cervical, supraclavicular, and axillary nodes normal. Resp: clear to auscultation bilaterally Cardio: regular rate and rhythm, S1, S2 normal, no murmur, click, rub or gallop GI: soft, non-tender; bowel sounds normal; no masses,  no organomegaly Extremities: extremities normal, atraumatic, no cyanosis or edema  and Homans sign is negative, no sign of DVT Neurologic: Grossly normal   Diagnostic Studies & Laboratory data:     Recent Radiology Findings:   Dg Chest 2 View  Result Date: 03/26/2018 CLINICAL DATA:  Shortness of breath EXAM: CHEST - 2 VIEW COMPARISON:  02/12/2018 FINDINGS: Small left pleural effusion. Left basilar atelectasis. Right lung is clear. No pneumothorax. Stable cardiomediastinal silhouette. Prior CABG. IMPRESSION: Left basilar atelectasis with a small left pleural effusion. Electronically Signed   By: Elige Ko   On: 03/26/2018 10:33    I have independently reviewed the above radiology studies  and reviewed the findings with the patient.   Recent Lab Findings: Lab Results  Component Value Date   WBC 10.0 01/11/2018   HGB 9.9 (L) 01/11/2018   HCT 29.4 (L) 01/11/2018   PLT 271 01/11/2018   GLUCOSE 143 (H) 01/12/2018   CHOL 169 01/04/2018   TRIG 51 01/04/2018   HDL 56 01/04/2018   LDLCALC 103 (H) 01/04/2018   ALT 13 (L) 01/05/2018   AST 16 01/05/2018   NA 136 01/12/2018   K 3.8 01/12/2018   CL 101 01/12/2018   CREATININE 1.04 01/12/2018   BUN 6 01/12/2018   CO2 27 01/12/2018   TSH 1.405 01/03/2018   INR 1.31 01/06/2018   HGBA1C 6.0 (H) 01/05/2018      Assessment / Plan:      Patient stable after recent coronary artery bypass grafting to return to work early next week, without restrictions. He has appointment with cardiology next week to review his medications, including the dose of statin, the use of Plavix, and his blood pressure medication.  He has not taken his lisinopril because of swelling of the lips. Delight Ovens MD      [address] [place] [place] [address] Office [phone]   Beeper [phone]  03/26/2018 11:04 AM

## 2018-04-07 ENCOUNTER — Ambulatory Visit: Payer: Non-veteran care | Admitting: Cardiology

## 2018-04-07 NOTE — Progress Notes (Deleted)
Cardiology Office Note:    Date:  04/07/2018   ID:  Cameron AbbottDavid D Fulghum, DOB 05/05/1956, MRN [id number]  PCP:  No primary care provider on file.  Cardiologist:  [name], MD    [facility]   No chief complaint on file.   History of Present Illness:    Cameron Camacho is a 62 y.o. 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  He also has a history of hyperlipidemia hypertension and prediabetes.  He is here today for followup and is doing well.  He denies any chest pain or pressure, SOB, DOE, PND, orthopnea, LE edema, dizziness, palpitations or syncope. He is compliant with his meds and is tolerating meds with no SE.      Past Medical History:  Diagnosis Date  . Hyperlipidemia   . Hypertension   . Prediabetes     Past Surgical History:  Procedure Laterality Date  . CORONARY ARTERY BYPASS GRAFT N/A 01/06/2018   Procedure: CORONARY ARTERY BYPASS GRAFTING (CABG) x three, using left internal mammary artery and right leg greater saphenous vein harvested endoscopically;  Surgeon: Delight OvensGerhardt, Edward B, MD;  [facility];  Service: Open Heart Surgery;  Laterality: N/A;  . LEFT HEART CATH AND CORONARY ANGIOGRAPHY N/A 01/05/2018   Procedure: LEFT HEART CATH AND CORONARY ANGIOGRAPHY;  Surgeon: Lennette BihariKelly, Thomas A, MD;  Location: [facility] INVASIVE CV LAB;  Service: Cardiovascular;  Laterality: N/A;  . TEE WITHOUT CARDIOVERSION N/A 01/06/2018   Procedure: TRANSESOPHAGEAL ECHOCARDIOGRAM (TEE);  Surgeon: Delight OvensGerhardt, Edward B, MD;  [facility];  Service: Open Heart Surgery;  Laterality: N/A;    Current Medications: No outpatient medications have been marked as taking for the 04/07/18 encounter (Appointment) with Quintella Reicherturner, Traci R, MD.     Allergies:   Lisinopril   Social History   Socioeconomic History  . Marital status: Married    Spouse  name: Not on file  . Number of children: Not on file  . Years of education: Not on file  . Highest education level: Not on file  Occupational History  . Not on file  Social Needs  . Financial resource strain: Not on file  . Food insecurity:    Worry: Not on file    Inability: Not on file  . Transportation needs:    Medical: Not on file    Non-medical: Not on file  Tobacco Use  . Smoking status: Passive Smoke Exposure - Never Smoker  . Smokeless tobacco: Never Used  . Tobacco comment: Smokes cannabis for pain control  Substance and Sexual Activity  . Alcohol use: Not Currently    Frequency: Never  . Drug use: Yes    Types: Marijuana    Comment: Occasional THC for pain  . Sexual activity: Not on file  Lifestyle  . Physical activity:    Days per week: Not on file    Minutes per session: Not on file  . Stress: Not on file  Relationships  . Social connections:    Talks on phone: Not on file    Gets together: Not on file    Attends religious service: Not on file    Active member of club or organization: Not on file    Attends meetings of clubs or organizations: Not on file    Relationship status: Not on file  Other Topics Concern  . Not on file  Social History Narrative  . Not on file     Family History: The patient's LIMA to the LAD, SVG to OM, SVG to RCA   family history includes CAD in his father; Hypertension in his brother and mother.  ROS:   Please see the history of present illness.    ROS  All other systems reviewed and negative.   EKGs/Labs/Other Studies Reviewed:    The following studies were reviewed today:   EKG:  EKG is not ordered today.  Recent Labs: 01/03/2018: B Natriuretic Peptide 102.8; TSH 1.405 01/05/2018: ALT 13 01/07/2018: Magnesium 2.1 01/11/2018: Hemoglobin 9.9; Platelets 271 01/12/2018: BUN 6; Creatinine, Ser 1.04; Potassium 3.8; Sodium 136   Recent Lipid Panel    Component Value Date/Time   CHOL 169 01/04/2018 0513   TRIG 51  01/04/2018 0513   HDL 56 01/04/2018 0513   CHOLHDL 3.0 01/04/2018 0513   VLDL 10 01/04/2018 0513   LDLCALC 103 (H) 01/04/2018 0513    Physical Exam:    VS:  There were no vitals taken for this visit.    Wt Readings from Last 3 Encounters:  03/26/18 202 lb (91.6 kg)  02/12/18 203 lb 12.8 oz (92.4 kg)  01/28/18 200 lb (90.7 kg)     GEN:  Well nourished, well developed in no acute distress HEENT: Normal NECK: No JVD; No carotid bruits LYMPHATICS: No lymphadenopathy CARDIAC: RRR, no murmurs, rubs, gallops RESPIRATORY:  Clear to auscultation without rales, wheezing or rhonchi  ABDOMEN: Soft, non-tender, non-distended MUSCULOSKELETAL:  No edema; No deformity  SKIN: Warm and dry NEUROLOGIC:  Alert and oriented x 3 PSYCHIATRIC:  Normal affect   ASSESSMENT:    1. Coronary artery calcification seen on CAT scan   2. Essential hypertension   3. Dyslipidemia, goal LDL below 70    PLAN:    In order of problems listed above:  1.  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  He has not had any further anginal symptoms.  He will continue on aspirin 81 mg daily, Plavix 75 mg daily, Lopressor 25 mg twice daily, high-dose statin.  2.  Hypertension -appears well controlled on exam today.  He will continue on amlodipine 5 mg daily and Lopressor 25 mg twice daily  3.  Hyperlipidemia -LDL goal less than 70 -Continue high-dose statin with Lipitor 80 mg daily. -Pete FLP and ALT since going on high-dose Lipitor   Medication Adjustments/Labs and Tests Ordered: Current medicines are reviewed at length with the patient today.  Concerns regarding medicines are outlined above.  No orders of the defined types were placed in this encounter.  No orders of the defined types were placed in this encounter.   Signed, Armanda Magic, MD  04/07/2018 7:58 AM    [facility]

## 2018-04-30 DIAGNOSIS — Z736 Limitation of activities due to disability: Secondary | ICD-10-CM

## 2018-06-08 DIAGNOSIS — Z736 Limitation of activities due to disability: Secondary | ICD-10-CM

## 2018-10-30 ENCOUNTER — Telehealth (HOSPITAL_COMMUNITY): Payer: Self-pay

## 2018-10-30 NOTE — Telephone Encounter (Signed)
Attempted to call patient in regards to Cardiac Rehab - LM on VM 

## 2018-12-03 ENCOUNTER — Telehealth: Payer: Self-pay

## 2018-12-03 NOTE — Telephone Encounter (Signed)
Patient contacted the office requesting a letter to return to work.  Patient is s/p CABG 12/2017 with Dr. Tyrone Sage.  He was last seen in July 2019 and was cleared by Dr. Tyrone Sage with no restrictions.  I advised the patient that in July of 2019 he could have returned to work with no restrictions.  I advised that I could write him a letter to return to work with no restrictions, however it would state he could have returned in July of 2019.  I did advise that he is cleared from our office but he did need to follow up yearly with his Cardiologist.  He acknowledged receipt.  Will write a letter if patient wishes to call back.

## 2019-01-08 ENCOUNTER — Other Ambulatory Visit: Payer: Self-pay | Admitting: Physician Assistant

## 2019-01-08 ENCOUNTER — Telehealth: Payer: Self-pay | Admitting: Cardiology

## 2019-01-08 MED ORDER — AMLODIPINE BESYLATE 5 MG PO TABS: 5.0000 mg | ORAL_TABLET | Freq: Every day | ORAL | 0 refills | Status: DC

## 2019-01-08 MED ORDER — NITROGLYCERIN 0.4 MG SL SUBL: 0.4000 mg | SUBLINGUAL_TABLET | SUBLINGUAL | 3 refills | Status: DC | PRN

## 2019-01-08 NOTE — Telephone Encounter (Signed)
Refilled

## 2019-01-08 NOTE — Telephone Encounter (Signed)
Pt calling requesting a refill on Nitroglycerin. This medication was removed from pt's medication. Would Dr. Mayford Knife like to prescribe this medication again for pt to be able to get at his pharmacy? Please address

## 2019-01-08 NOTE — Telephone Encounter (Signed)
Ok to refill 

## 2019-03-31 ENCOUNTER — Other Ambulatory Visit: Payer: Self-pay | Admitting: Physician Assistant

## 2019-04-08 ENCOUNTER — Telehealth: Payer: Self-pay | Admitting: Cardiology

## 2019-04-08 NOTE — Telephone Encounter (Signed)

## 2019-04-09 ENCOUNTER — Other Ambulatory Visit: Payer: Self-pay

## 2019-04-09 ENCOUNTER — Encounter: Payer: Self-pay | Admitting: Cardiology

## 2019-04-09 ENCOUNTER — Telehealth (INDEPENDENT_AMBULATORY_CARE_PROVIDER_SITE_OTHER): Payer: Self-pay | Admitting: Cardiology

## 2019-04-09 VITALS — BP 128/81 | HR 75 | Wt 195.0 lb

## 2019-04-09 DIAGNOSIS — I10 Essential (primary) hypertension: Secondary | ICD-10-CM

## 2019-04-09 DIAGNOSIS — I2510 Atherosclerotic heart disease of native coronary artery without angina pectoris: Secondary | ICD-10-CM

## 2019-04-09 DIAGNOSIS — E785 Hyperlipidemia, unspecified: Secondary | ICD-10-CM

## 2019-04-09 NOTE — Progress Notes (Signed)
Virtual Visit via Telephone Note   This visit type was conducted due to national recommendations for restrictions regarding the COVID-19 Pandemic (e.g. social distancing) in an effort to limit this patient's exposure and mitigate transmission in our community.  Due to his co-morbid illnesses, this patient is at least at moderate risk for complications without adequate follow up.  This format is felt to be most appropriate for this patient at this time.  The patient did not have access to video technology/had technical difficulties with video requiring transitioning to audio format only (telephone).  All issues noted in this document were discussed and addressed.  No physical exam could be performed with this format.  Please refer to the patient's chart for his  consent to telehealth for [facility]   Evaluation Performed:  Follow-up visit  This visit type was conducted due to national recommendations for restrictions regarding the COVID-19 Pandemic (e.g. social distancing).  This format is felt to be most appropriate for this patient at this time.  All issues noted in this document were discussed and addressed.  No physical exam was performed (except for noted visual exam findings with Video Visits).  Please refer to the patient's chart (MyChart message for video visits and phone note for telephone visits) for the patient's consent to telehealth for [facility].  Date:  04/09/2019   ID:  Cameron Camacho, DOB 1956-01-11, MRN [id number]  Patient Location:  Home  Provider location:   [facility]  PCP:  Patient, No Pcp Per  Cardiologist:  Fransico Him, MD  Electrophysiologist:  None   Chief Complaint:  CAD  History of Present Illness:    Cameron Camacho is a 63 y.o. male who presents via audio/video conferencing for a telehealth visit today.    Cameron Camacho is a 63 y.o. male patient who was admitted to [facility] with NSTEMI and found to have critical distal left main and multivessel  disease and underwe61 CABG times 3, 01/06/18 LIMA to the LAD, SVG to OM, SVG to RCA. He was also started on ACE inhibitor for hypertension and Plavix for CAD.  He is here today for followup and is doing well.  He denies any chest pain or pressure, SOB, DOE, PND, orthopnea, LE edema, dizziness, palpitations or syncope. He is compliant with his meds and is tolerating meds with no SE.    The patient does not have symptoms concerning for COVID-19 infection (fever, chills, cough, or new shortness of breath).    Prior CV studies:   The following studies were reviewed today:  none  Past Medical History:  Diagnosis Date  . Hyperlipidemia   . Hypertension   . Prediabetes    Past Surgical History:  Procedure Laterality Date  . CORONARY ARTERY BYPASS GRAFT N/A 01/06/2018   Procedure: CORONARY ARTERY BYPASS GRAFTING (CABG) x three, using left internal mammary artery and right leg greater saphenous vein harvested endoscopically;  Surgeon: Grace Isaac, MD;  Location: [place];  Service: Open Heart Surgery;  Laterality: N/A;  . LEFT HEART CATH AND CORONARY ANGIOGRAPHY N/A 01/05/2018   Procedure: LEFT HEART CATH AND CORONARY ANGIOGRAPHY;  Surgeon: Troy Sine, MD;  Location: [place] CV LAB;  Service: Cardiovascular;  Laterality: N/A;  . TEE WITHOUT CARDIOVERSION N/A 01/06/2018   Procedure: TRANSESOPHAGEAL ECHOCARDIOGRAM (TEE);  Surgeon: Grace Isaac, MD;  Location: [place];  Service: Open Heart Surgery;  Laterality: N/A;     No outpatient medications have been marked as taking for the  04/09/19 encounter (Telemedicine) with Quintella Reicherturner,  R, MD.     Allergies:   Lisinopril   Social History   Tobacco Use  . Smoking status: Passive Smoke Exposure - Never Smoker  . Smokeless tobacco: Never Used  . Tobacco comment: Smokes cannabis for pain control  Substance Use Topics  . Alcohol use: Not Currently    Frequency: Never  . Drug use: Yes    Types: Marijuana    Comment: Occasional THC  for pain     Family Hx: The patient's family history includes CAD in his father; Hypertension in his brother and mother.  ROS:   Please see the history of present illness.     All other systems reviewed and are negative.   Labs/Other Tests and Data Reviewed:    Recent Labs: No results found for requested labs within last 8760 hours.   Recent Lipid Panel Lab Results  Component Value Date/Time   CHOL 169 01/04/2018 05:13 AM   TRIG 51 01/04/2018 05:13 AM   HDL 56 01/04/2018 05:13 AM   CHOLHDL 3.0 01/04/2018 05:13 AM   LDLCALC 103 (H) 01/04/2018 05:13 AM    Wt Readings from Last 3 Encounters:  04/09/19 195 lb (88.5 kg)  03/26/18 202 lb (91.6 kg)  02/12/18 203 lb 12.8 oz (92.4 kg)     Objective:    Vital Signs:  BP 128/81   Pulse 75   Wt 195 lb (88.5 kg)   BMI 27.98 kg/m     ASSESSMENT & PLAN:    1.  ASCAD -s/p NSTEMI with subsequent CABG 2019 -he has not had any anginal symptoms -continue on ASA 81mg  daily, Plavix 75mg  daily, high dose statin and BB -he is walking 3 miles daliy  2. Hypertension -Bp is controlled on exam today -continue on Lopressor 25mg  BID -he has not been taking amlodipine consistently  -I have encouraged him to take the amlodipine daliy  3.  Hyperlipidemia -LDL goal is < 70 -his LDL was 103 a year ago -repeat FLP and ALT  COVID-19 Education: The signs and symptoms of COVID-19 were discussed with the patient and how to seek care for testing (follow up with PCP or arrange E-visit).  The importance of social distancing was discussed today.  Patient Risk:   After full review of this patient's clinical status, I feel that they are at least moderate risk at this time.  Time:   Today, I have spent 20 minutes directly with the patient on telemedicine discussing medical problems including CAD, HTN, lipids.  We also reviewed the symptoms of COVID 19 and the ways to protect against contracting the virus with telehealth technology.  I spent an  additional 5 minutes reviewing patient's chart including labs.  Medication Adjustments/Labs and Tests Ordered: Current medicines are reviewed at length with the patient today.  Concerns regarding medicines are outlined above.  Tests Ordered: No orders of the defined types were placed in this encounter.  Medication Changes: No orders of the defined types were placed in this encounter.   Disposition:  Follow up in 1 year(s)  Signed, Armanda Magicraci , MD  04/09/2019 8:44 AM    [facility]

## 2019-04-09 NOTE — Patient Instructions (Signed)
Called and left voicemail with pt asking to arrange a date for lab work. Dr Radford Pax will follow up with him in 12 months.

## 2019-04-13 ENCOUNTER — Other Ambulatory Visit: Payer: Self-pay

## 2019-07-01 ENCOUNTER — Other Ambulatory Visit: Payer: Self-pay | Admitting: Cardiology

## 2019-10-26 ENCOUNTER — Other Ambulatory Visit: Payer: Self-pay | Admitting: Physician Assistant

## 2019-12-13 ENCOUNTER — Other Ambulatory Visit: Payer: Self-pay | Admitting: Physician Assistant

## 2020-02-16 IMAGING — DX DG CHEST 1V PORT
1 series · 1 of 1 positions shown · non-contrast
Comparison: CT 01/04/2018.  Radiographs 01/03/2018.

CLINICAL DATA: Postop CABG.

EXAM:
PORTABLE CHEST 1 VIEW

[chest]
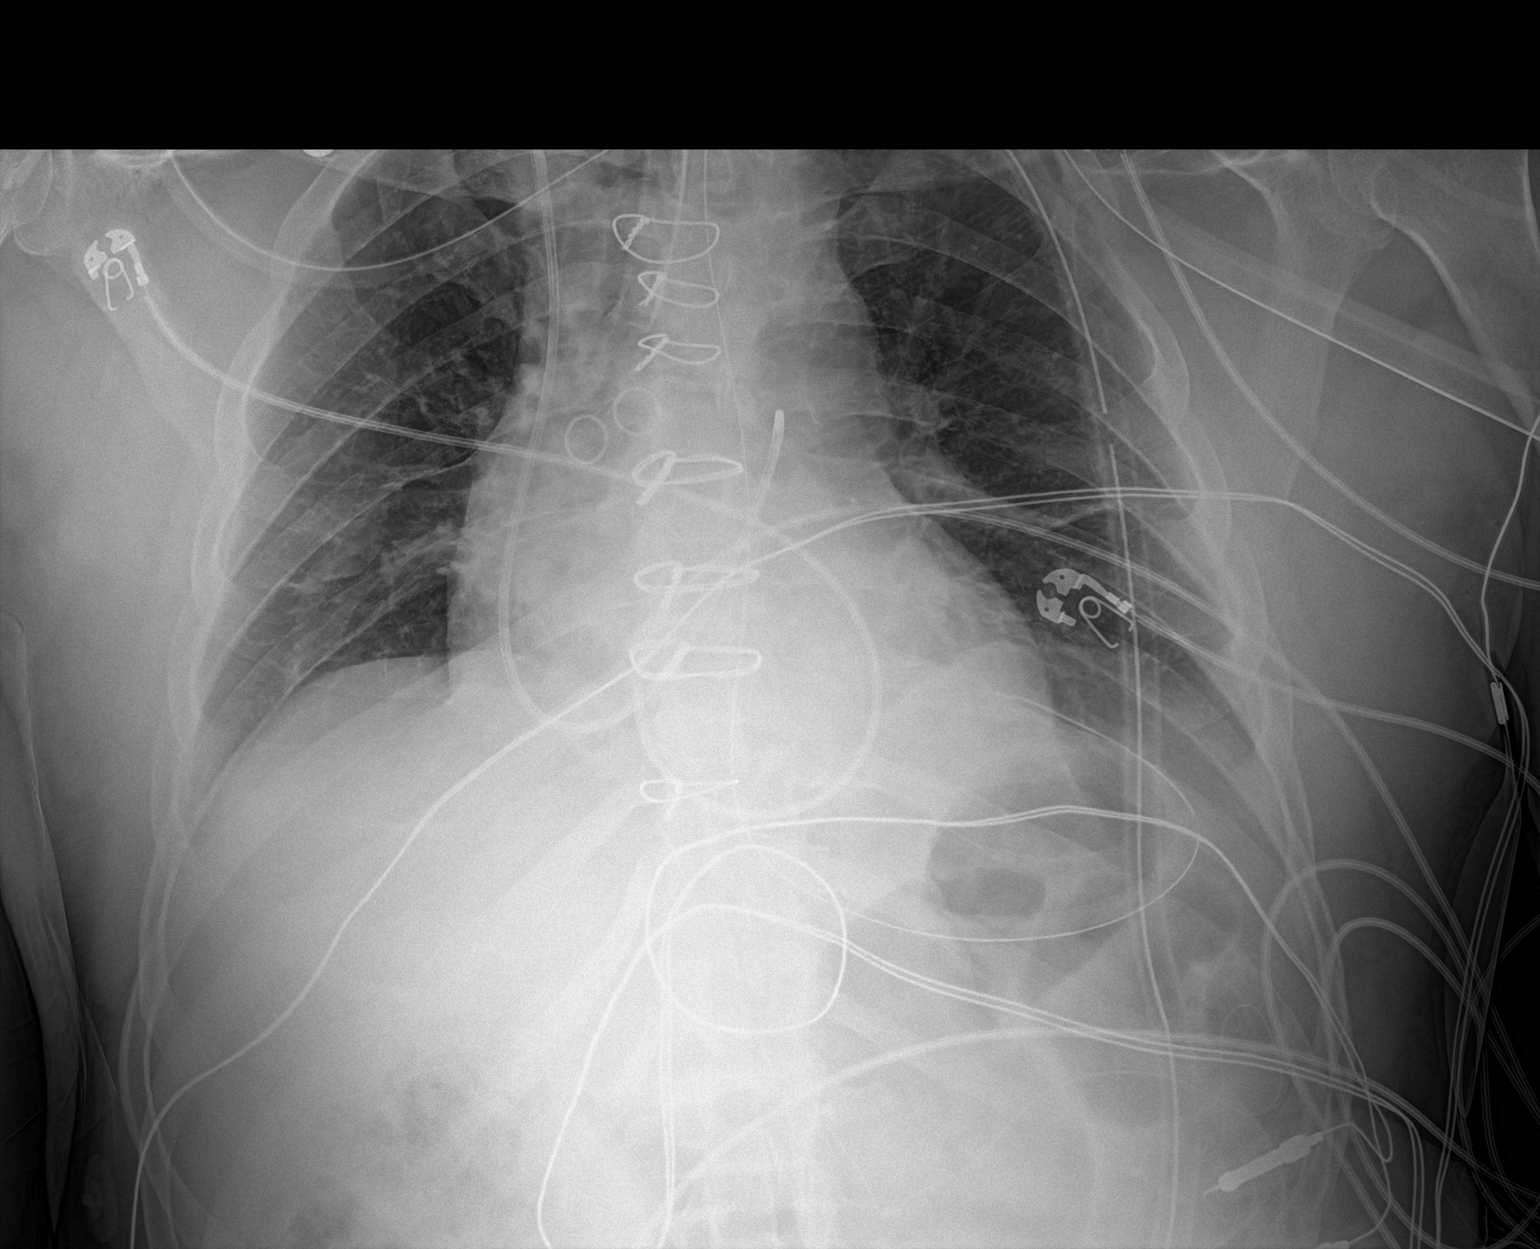

[1 of 1 positions shown; findings below may reference images not displayed]

FINDINGS: 9789 hours. Interval median sternotomy and CABG. The endotracheal
tube tip is 2 cm above the carina. Right IJ Swan-Ganz catheter
projects into the main pulmonary artery. Nasogastric tube projects
to the gastric fundus. Left chest tube is in place.

The heart size and mediastinal contours are stable. There is minimal
atelectasis at the lung bases. No edema, pneumothorax or significant
pleural effusion.
IMPRESSION: No demonstrated significant complication following CABG. Support
system as above.

## 2020-02-19 IMAGING — DX DG CHEST 2V
2 series · 2 of 2 positions shown · non-contrast
Comparison: Portable chest x-ray of 01/08/2018

CLINICAL DATA: Chest soreness after CABG

EXAM:
CHEST - 2 VIEW

[w chest pa]
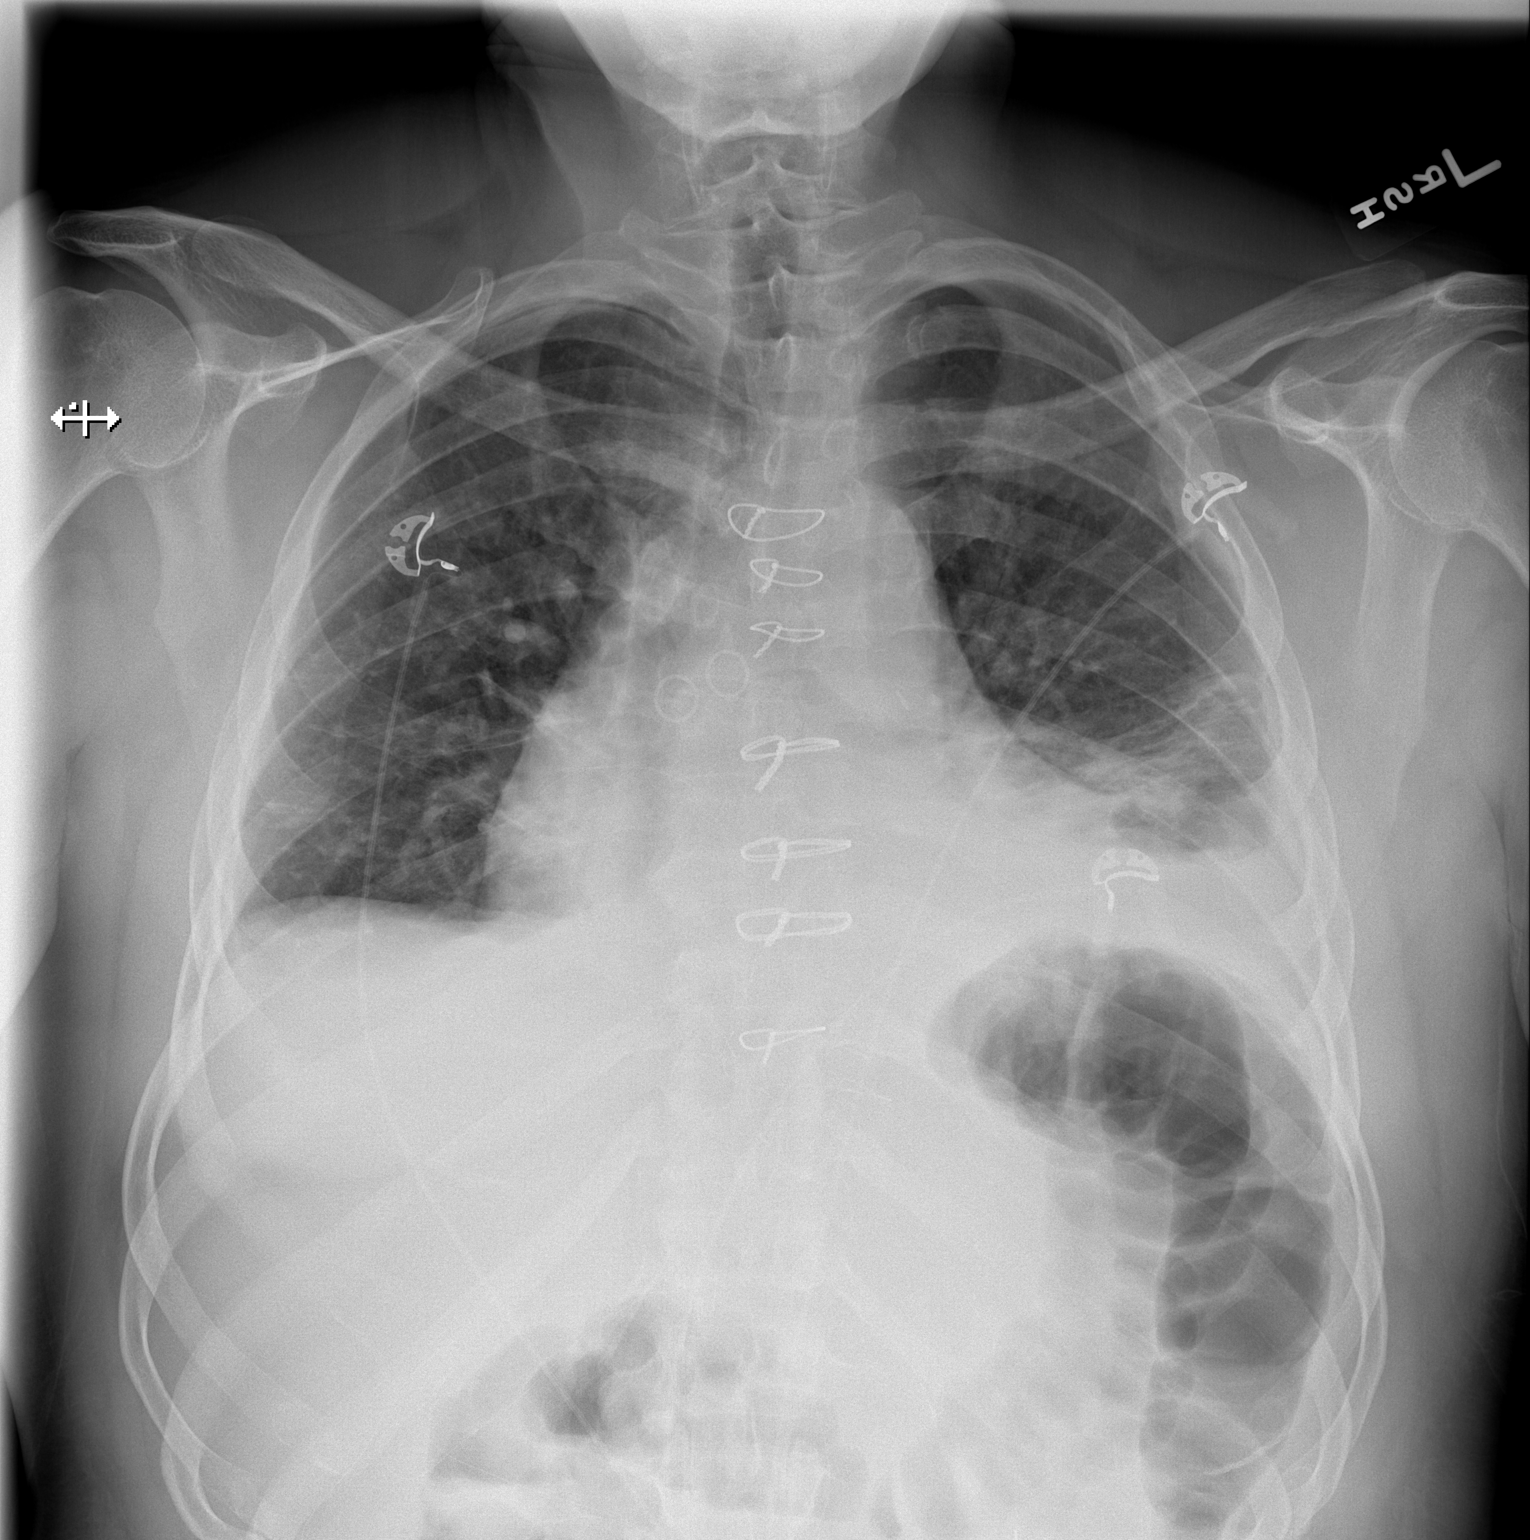

[w chest lat]
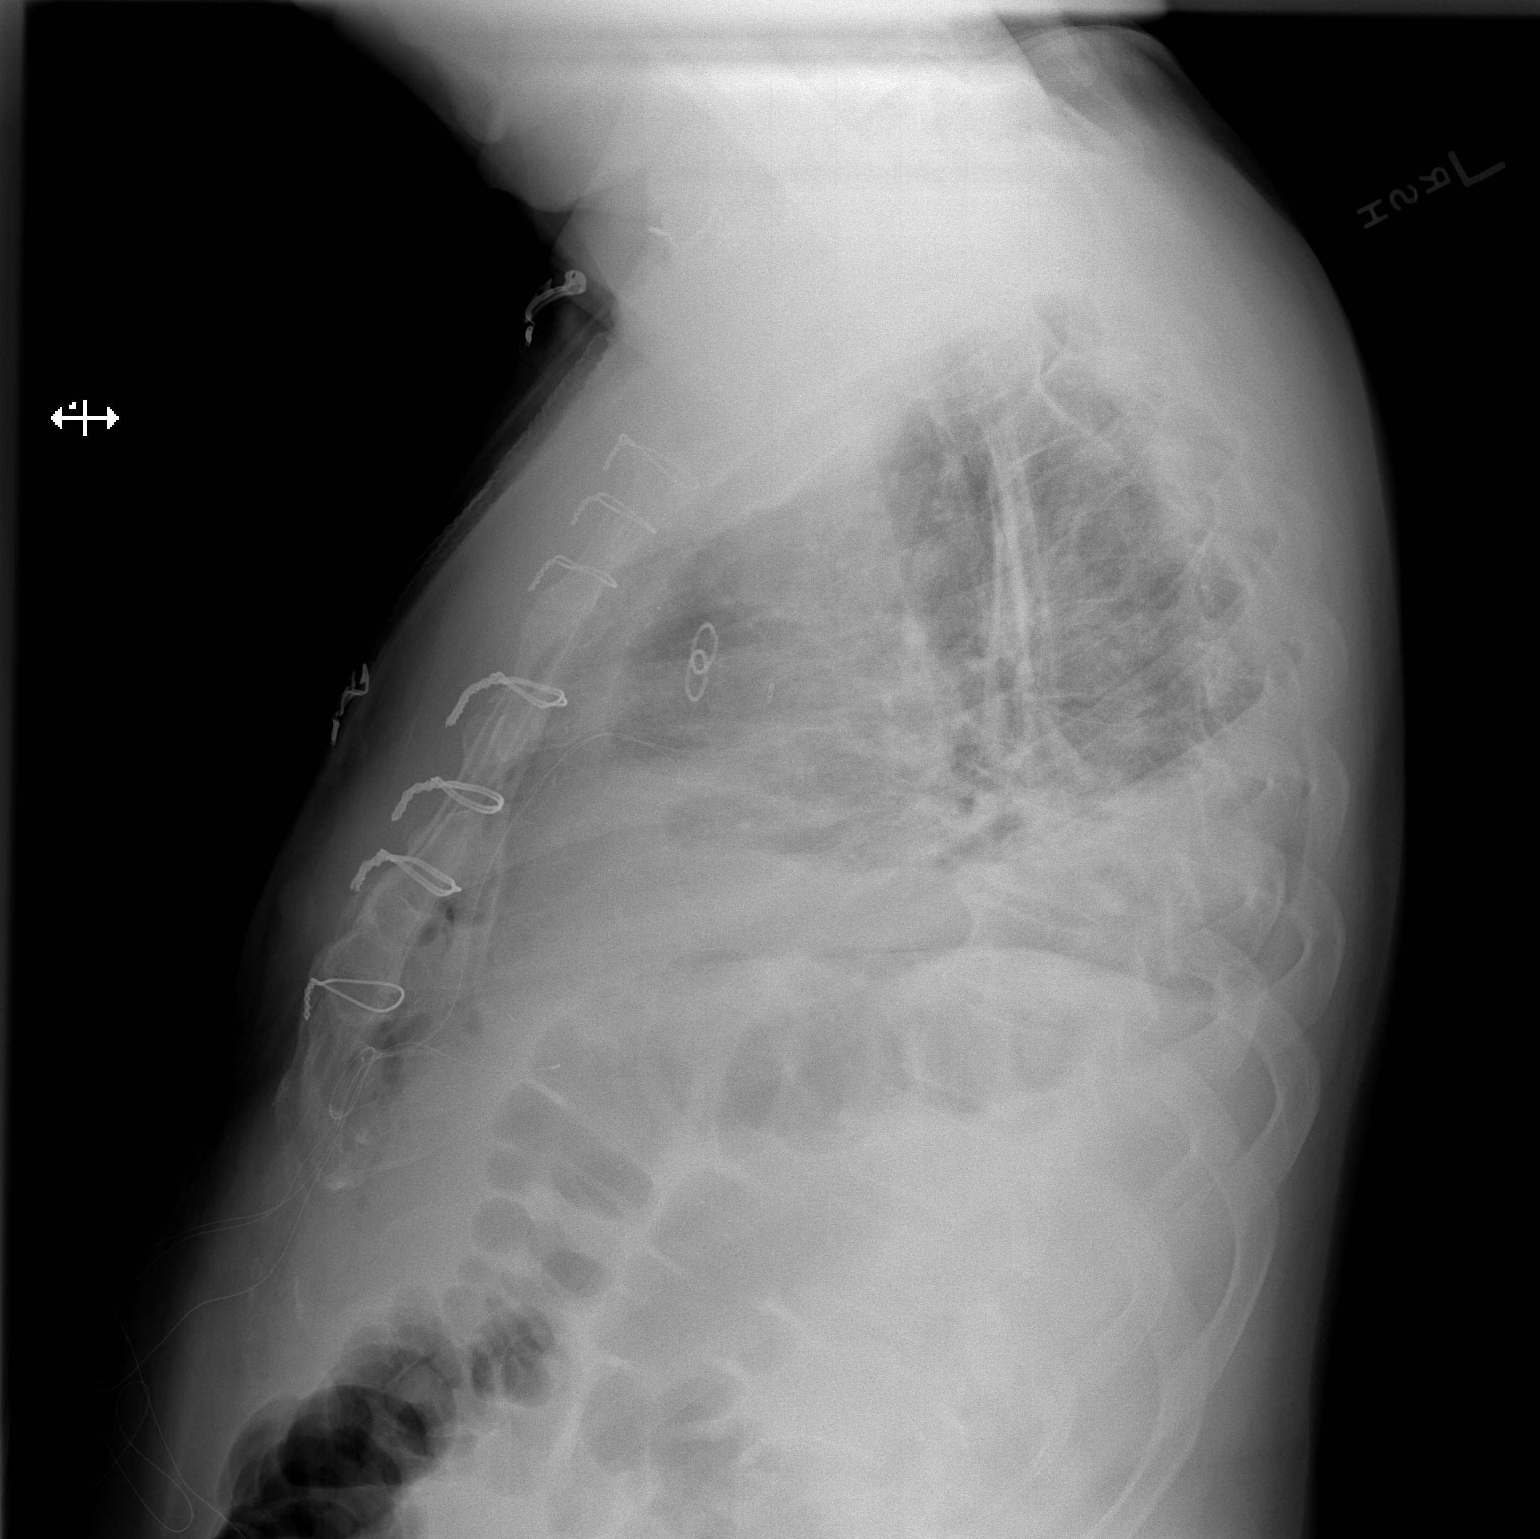

[2 of 2 positions shown; findings below may reference images not displayed]

FINDINGS: Aeration of the lungs has improved. There are small pleural
effusions present left greater than right with left basilar
atelectasis noted as well. Cardiomegaly is stable. Median sternotomy
sutures are noted from CABG.
IMPRESSION: 1. Improved aeration with improving basilar atelectasis.
2. Persistent small effusions left-greater-than-right with
persistent left basilar atelectasis.
3. Stable cardiomegaly.

## 2020-03-24 IMAGING — CR DG CHEST 2V
2 series · 2 of 2 positions shown · non-contrast
Comparison: January 09, 2018

CLINICAL DATA: Shortness of breath. Recent coronary artery bypass
grafting

EXAM:
CHEST - 2 VIEW

[w chest pa *]
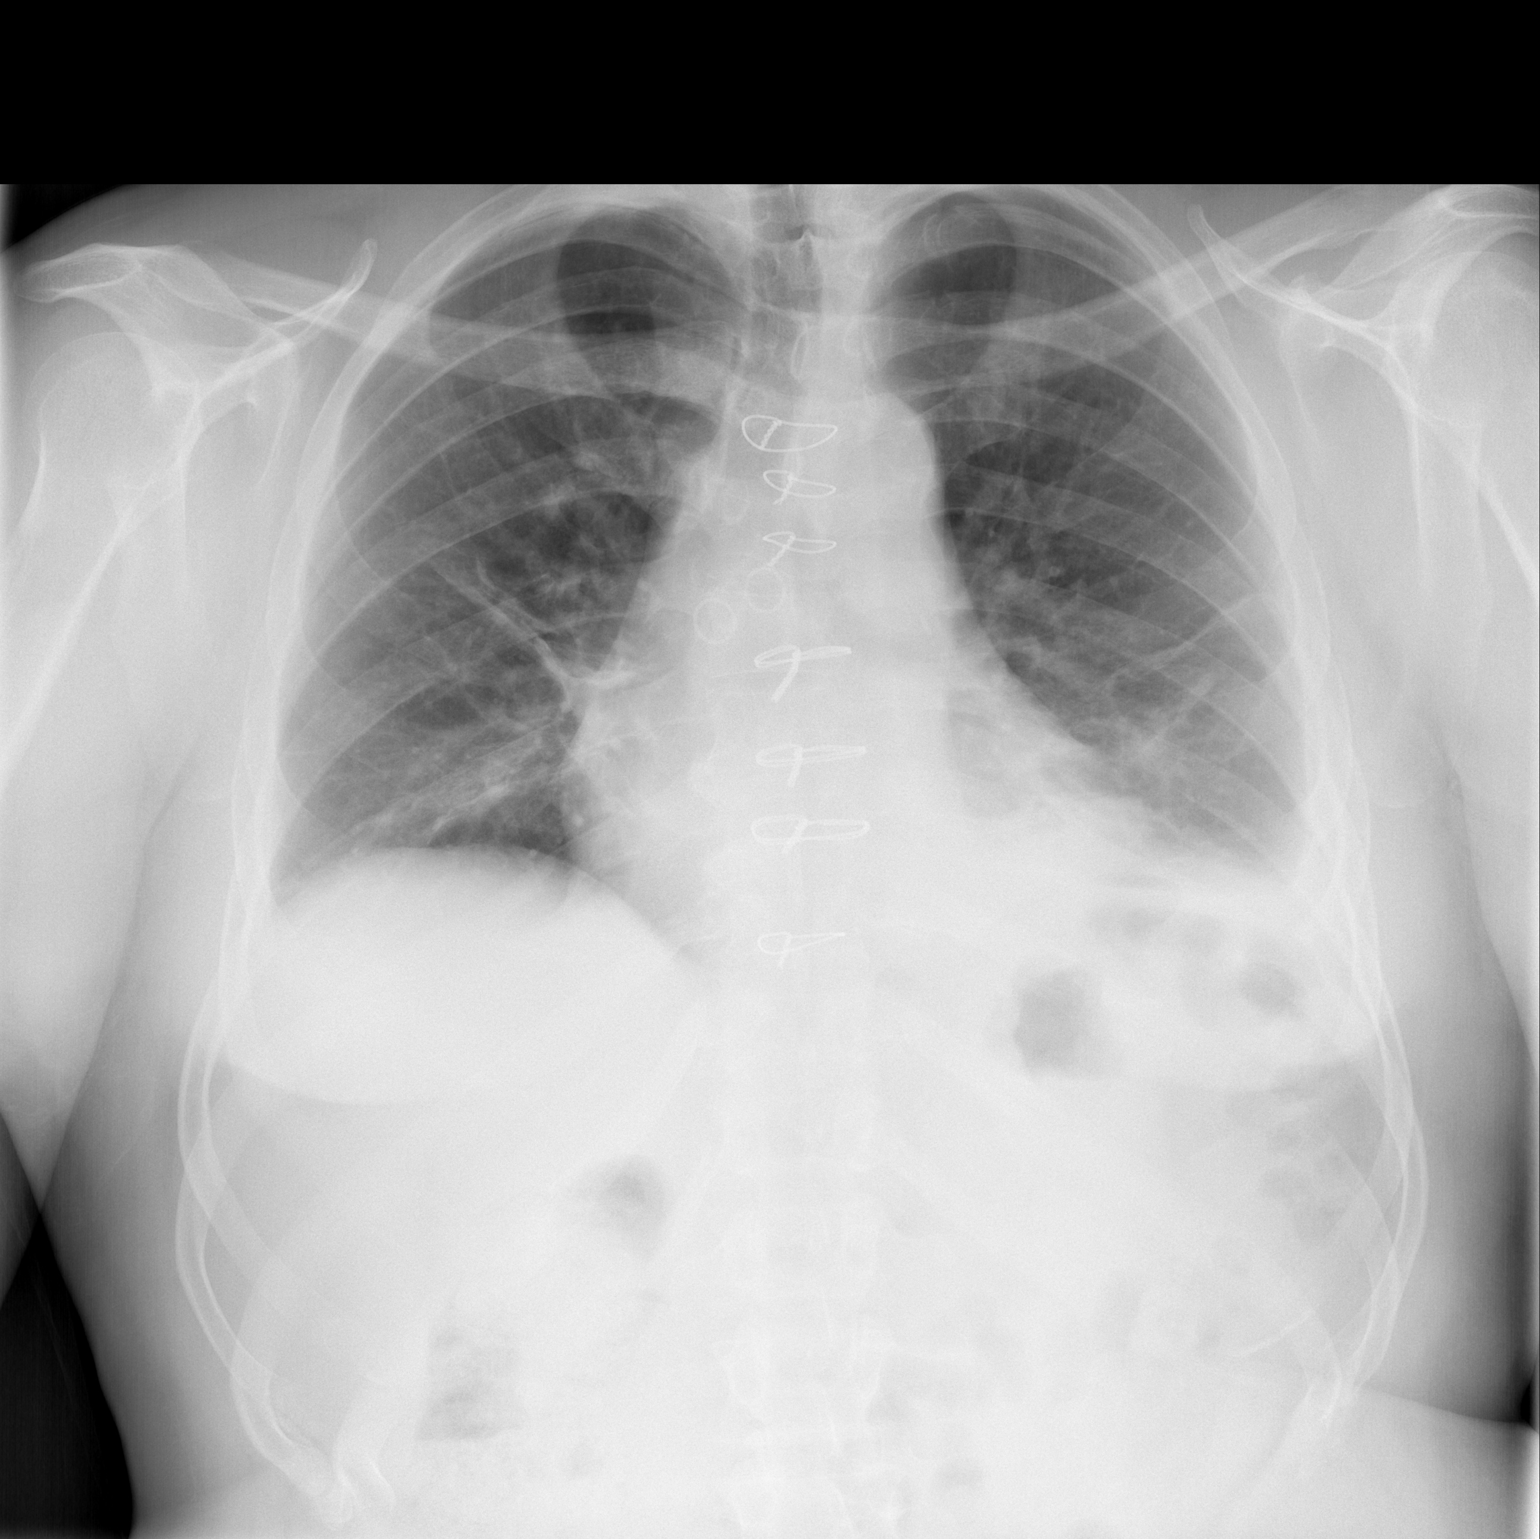

[w chest lat *]
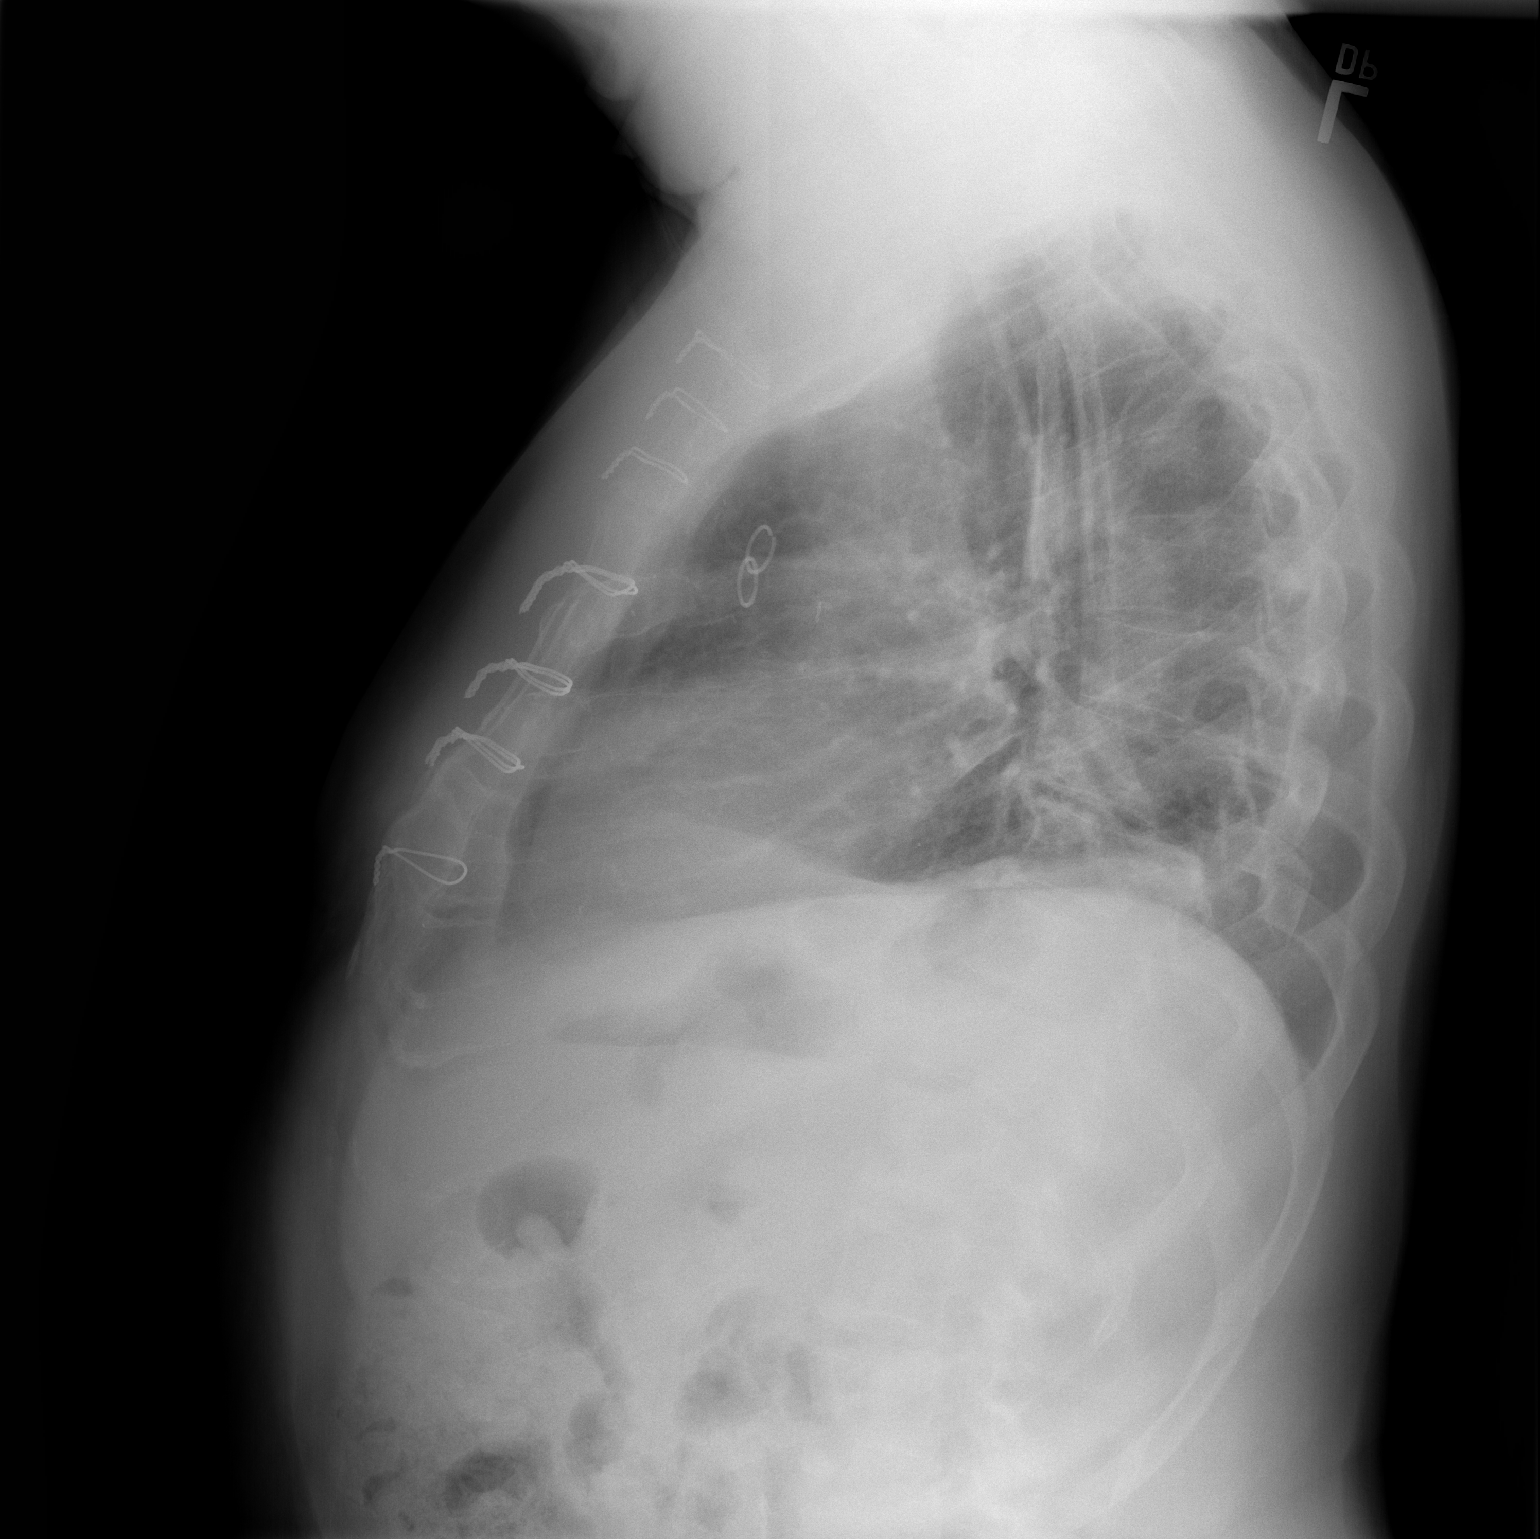

[2 of 2 positions shown; findings below may reference images not displayed]

FINDINGS: There is a small left pleural effusion. There is patchy bibasilar
atelectasis, somewhat more on the left than on the right. Lungs
elsewhere are clear. Heart size and pulmonary vascularity are
normal. Patient is status post coronary artery bypass grafting. No
adenopathy. No evident bone lesions. No pneumothorax.
IMPRESSION: Bibasilar atelectasis, somewhat more on the left than on the right,
similar to prior study. Small left pleural effusion. No new opacity.
Stable cardiac silhouette.

## 2020-05-14 ENCOUNTER — Other Ambulatory Visit: Payer: Self-pay | Admitting: Cardiology

## 2021-03-09 ENCOUNTER — Other Ambulatory Visit: Payer: Self-pay | Admitting: Cardiology

## 2021-10-14 ENCOUNTER — Other Ambulatory Visit: Payer: Self-pay | Admitting: Physician Assistant

## 2021-10-14 ENCOUNTER — Other Ambulatory Visit: Payer: Self-pay | Admitting: Cardiology

## 2021-10-16 ENCOUNTER — Other Ambulatory Visit: Payer: Self-pay | Admitting: Cardiology

## 2021-12-10 ENCOUNTER — Other Ambulatory Visit: Payer: Self-pay | Admitting: Cardiology

## 2022-03-20 ENCOUNTER — Ambulatory Visit: Payer: Self-pay | Admitting: Cardiology

## 2022-03-26 ENCOUNTER — Ambulatory Visit: Payer: Self-pay | Admitting: Cardiology

## 2022-03-29 ENCOUNTER — Telehealth: Payer: Self-pay | Admitting: *Deleted

## 2022-03-29 NOTE — Telephone Encounter (Signed)
   Pre-operative Risk Assessment    Patient Name: Cameron Camacho  DOB: 21-Dec-1955 MRN: [id number]     Request for Surgical Clearance    Procedure:  Dental Extraction - Amount of Teeth to be Pulled:  7  Date of Surgery:  Clearance TBD                                 Surgeon:  Warden Fillers, DDS Surgeon's Group or Practice Name:  [facility] Phone number:  [phone] Fax number:  [phone]   Type of Clearance Requested:   - Pharmacy:  Hold Aspirin NOT INDICATED   Type of Anesthesia:  Local    Additional requests/questions:    [name]   03/29/2022, 12:44 PM

## 2022-03-29 NOTE — Telephone Encounter (Signed)
Primary Cardiologist:Traci Turner, MD  Chart reviewed as part of pre-operative protocol coverage. Because of Cameron Camacho's past medical history and time since last visit, he/she will require a follow-up visit in order to better assess preoperative cardiovascular risk.  Pre-op covering staff: - Please schedule appointment and call patient to inform them. - Please contact requesting surgeon's office via preferred method (i.e, phone, fax) to inform them of need for appointment prior to surgery.  If applicable, this message will also be routed to pharmacy pool and/or primary cardiologist for input on holding anticoagulant/antiplatelet agent as requested below so that this information is available at time of patient's appointment.   Levi Aland, NP-C    03/29/2022, 4:42 PM [facility] [address] Office [phone] Fax [phone]

## 2022-04-01 NOTE — Telephone Encounter (Signed)
I s/w the pt to schedule an IN OFFICE appt for pre op clearance. Pt states he does not want to keep changing his appt with Dr. Mayford Knife. I assured the pt that he can keep the 08/2022 appt with Dr. Mayford Knife and we can schedule an appt for pre op clearance as early as this Wed 04/03/22. Pt said to let him have 24 hours to process this due to the cost for the dental work and the appts needed.. I said that is fine and he can call ck when he is ready.   I will update the requesting office as FYI only.

## 2022-04-11 ENCOUNTER — Other Ambulatory Visit: Payer: Self-pay | Admitting: Cardiology

## 2022-07-06 ENCOUNTER — Other Ambulatory Visit: Payer: Self-pay | Admitting: Cardiology

## 2022-07-08 NOTE — Telephone Encounter (Signed)
Pt requesting a refill on clopidogrel. Pt has not been seen since 2020, but pt has an appt with Dr. Radford Pax for December 2023. Would Dr. Radford Pax like to refill this medication until appt time? Please address

## 2022-07-24 ENCOUNTER — Ambulatory Visit: Payer: Self-pay | Admitting: Cardiology

## 2022-08-09 ENCOUNTER — Ambulatory Visit: Payer: Self-pay | Admitting: Cardiology

## 2022-10-31 ENCOUNTER — Other Ambulatory Visit: Payer: Self-pay | Admitting: Cardiology

## 2022-10-31 MED ORDER — CLOPIDOGREL BISULFATE 75 MG PO TABS
75.0000 mg | ORAL_TABLET | Freq: Every day | ORAL | 0 refills | Status: DC
Start: 2022-10-31 — End: 2023-06-04

## 2022-10-31 MED ORDER — NITROGLYCERIN 0.4 MG SL SUBL: SUBLINGUAL_TABLET | SUBLINGUAL | 0 refills | Status: DC

## 2022-10-31 MED ORDER — AMLODIPINE BESYLATE 5 MG PO TABS: ORAL_TABLET | ORAL | 0 refills | Status: DC

## 2022-10-31 NOTE — Telephone Encounter (Signed)
nitroGLYCERIN (NITROSTAT) 0.4 MG SL tablet  - pt also needs this refilled. Pt had to r/s his appt due to provider out the office, he has been r/s for 12/17/22

## 2022-10-31 NOTE — Telephone Encounter (Signed)
Pt has not been seen since 2020, pt has cancelled appt numerous times. Pt has an upcoming appt 12/17/22 with Dr. Radford Pax. Would Dr. Radford Pax like to refill pt's medications until appt time? Please advise

## 2022-10-31 NOTE — Telephone Encounter (Signed)
*  STAT* If patient is at the pharmacy, call can be transferred to refill team.   1. Which medications need to be refilled? (please list name of each medication and dose if known) amLODipine (NORVASC) 5 MG tablet  clopidogrel (PLAVIX) 75 MG tablet     2. Which pharmacy/location (including street and city if local pharmacy) is medication to be sent to?   [facility] [place] ([place] - [address]    3. Do they need a 30 day or 90 day supply? [place]

## 2022-11-08 ENCOUNTER — Ambulatory Visit: Payer: Self-pay | Admitting: Cardiology

## 2022-12-17 ENCOUNTER — Ambulatory Visit: Payer: BLUE CROSS/BLUE SHIELD | Admitting: Cardiology

## 2023-04-22 ENCOUNTER — Inpatient Hospital Stay (HOSPITAL_COMMUNITY): Payer: 59

## 2023-04-22 ENCOUNTER — Inpatient Hospital Stay (HOSPITAL_COMMUNITY)
Admission: EM | Admit: 2023-04-22 | Discharge: 2023-04-23 | DRG: 282 | Discharge status: Against Medical Advice | Payer: 59 | Attending: Cardiovascular Disease | Admitting: Cardiovascular Disease

## 2023-04-22 ENCOUNTER — Encounter (HOSPITAL_COMMUNITY): Payer: Self-pay | Admitting: Cardiovascular Disease

## 2023-04-22 ENCOUNTER — Encounter (HOSPITAL_COMMUNITY)
Admission: EM | Discharge status: Against Medical Advice | Payer: Self-pay | Source: Home / Self Care | Attending: Cardiovascular Disease

## 2023-04-22 ENCOUNTER — Emergency Department (HOSPITAL_COMMUNITY): Payer: 59

## 2023-04-22 DIAGNOSIS — Z5329 Procedure and treatment not carried out because of patient's decision for other reasons: Secondary | ICD-10-CM | POA: Diagnosis present

## 2023-04-22 DIAGNOSIS — I10 Essential (primary) hypertension: Secondary | ICD-10-CM | POA: Diagnosis present

## 2023-04-22 DIAGNOSIS — I214 Non-ST elevation (NSTEMI) myocardial infarction: Secondary | ICD-10-CM | POA: Diagnosis not present

## 2023-04-22 DIAGNOSIS — E785 Hyperlipidemia, unspecified: Secondary | ICD-10-CM | POA: Diagnosis present

## 2023-04-22 DIAGNOSIS — Z7982 Long term (current) use of aspirin: Secondary | ICD-10-CM | POA: Diagnosis not present

## 2023-04-22 DIAGNOSIS — I25110 Atherosclerotic heart disease of native coronary artery with unstable angina pectoris: Secondary | ICD-10-CM

## 2023-04-22 DIAGNOSIS — Z8249 Family history of ischemic heart disease and other diseases of the circulatory system: Secondary | ICD-10-CM

## 2023-04-22 DIAGNOSIS — I499 Cardiac arrhythmia, unspecified: Secondary | ICD-10-CM | POA: Diagnosis not present

## 2023-04-22 DIAGNOSIS — Z951 Presence of aortocoronary bypass graft: Secondary | ICD-10-CM | POA: Diagnosis not present

## 2023-04-22 DIAGNOSIS — I517 Cardiomegaly: Secondary | ICD-10-CM | POA: Diagnosis not present

## 2023-04-22 DIAGNOSIS — I252 Old myocardial infarction: Secondary | ICD-10-CM

## 2023-04-22 DIAGNOSIS — R7303 Prediabetes: Secondary | ICD-10-CM | POA: Diagnosis not present

## 2023-04-22 DIAGNOSIS — R079 Chest pain, unspecified: Secondary | ICD-10-CM

## 2023-04-22 DIAGNOSIS — I200 Unstable angina: Principal | ICD-10-CM

## 2023-04-22 DIAGNOSIS — I5030 Unspecified diastolic (congestive) heart failure: Secondary | ICD-10-CM

## 2023-04-22 DIAGNOSIS — I2510 Atherosclerotic heart disease of native coronary artery without angina pectoris: Secondary | ICD-10-CM | POA: Diagnosis present

## 2023-04-22 DIAGNOSIS — Z79899 Other long term (current) drug therapy: Secondary | ICD-10-CM

## 2023-04-22 DIAGNOSIS — Z7902 Long term (current) use of antithrombotics/antiplatelets: Secondary | ICD-10-CM | POA: Diagnosis not present

## 2023-04-22 DIAGNOSIS — R0789 Other chest pain: Secondary | ICD-10-CM | POA: Diagnosis not present

## 2023-04-22 DIAGNOSIS — I491 Atrial premature depolarization: Secondary | ICD-10-CM | POA: Diagnosis not present

## 2023-04-22 DIAGNOSIS — Z91148 Patient's other noncompliance with medication regimen for other reason: Secondary | ICD-10-CM | POA: Diagnosis not present

## 2023-04-22 DIAGNOSIS — J9811 Atelectasis: Secondary | ICD-10-CM | POA: Diagnosis not present

## 2023-04-22 HISTORY — PX: LEFT HEART CATH AND CORS/GRAFTS ANGIOGRAPHY: CATH118250

## 2023-04-22 LAB — CBC
HCT: 44.1 % (ref 39.0–52.0)
Hemoglobin: 15.5 g/dL (ref 13.0–17.0)
MCH: 33.2 pg (ref 26.0–34.0)
MCHC: 35.1 g/dL (ref 30.0–36.0)
MCV: 94.4 fL (ref 80.0–100.0)
Platelets: 229 10*3/uL (ref 150–400)
RBC: 4.67 MIL/uL (ref 4.22–5.81)
RDW: 12.8 % (ref 11.5–15.5)
WBC: 6.5 10*3/uL (ref 4.0–10.5)
nRBC: 0 % (ref 0.0–0.2)

## 2023-04-22 LAB — ECHOCARDIOGRAM COMPLETE
AR max vel: 2.94 cm2
AV Peak grad: 7.5 mmHg
Ao pk vel: 1.37 m/s
Area-P 1/2: 3.21 cm2
Height: 71 in
MV M vel: 1.77 m/s
MV Peak grad: 12.5 mmHg
S' Lateral: 3.25 cm
Weight: 3120 oz

## 2023-04-22 LAB — BASIC METABOLIC PANEL
Anion gap: 10 (ref 5–15)
BUN: 7 mg/dL — ABNORMAL LOW (ref 8–23)
CO2: 24 mmol/L (ref 22–32)
Calcium: 8.8 mg/dL — ABNORMAL LOW (ref 8.9–10.3)
Chloride: 101 mmol/L (ref 98–111)
Creatinine, Ser: 1.1 mg/dL (ref 0.61–1.24)
GFR, Estimated: >60 mL/min (ref >60)
Glucose, Bld: 159 mg/dL — ABNORMAL HIGH (ref 70–99)
Potassium: 3.5 mmol/L (ref 3.5–5.1)
Sodium: 135 mmol/L (ref 135–145)

## 2023-04-22 LAB — TROPONIN I (HIGH SENSITIVITY)
Troponin I (High Sensitivity): 182 ng/L (ref <18)
Troponin I (High Sensitivity): 281 ng/L (ref <18)

## 2023-04-22 LAB — CBG MONITORING, ED: Glucose-Capillary: 156 mg/dL — ABNORMAL HIGH (ref 70–99)

## 2023-04-22 LAB — BRAIN NATRIURETIC PEPTIDE: B Natriuretic Peptide: 119.7 pg/mL — ABNORMAL HIGH (ref 0.0–100.0)

## 2023-04-22 SURGERY — LEFT HEART CATH AND CORS/GRAFTS ANGIOGRAPHY
Anesthesia: LOCAL

## 2023-04-22 MED ORDER — HEPARIN SODIUM (PORCINE) 1000 UNIT/ML IJ SOLN
INTRAMUSCULAR | Status: AC
  Filled 2023-04-22: qty 10

## 2023-04-22 MED ORDER — CLOPIDOGREL BISULFATE 75 MG PO TABS: 75.0000 mg | ORAL_TABLET | Freq: Every day | ORAL | Status: DC

## 2023-04-22 MED ORDER — CLOPIDOGREL BISULFATE 300 MG PO TABS: ORAL_TABLET | ORAL | Status: DC | PRN

## 2023-04-22 MED ORDER — HEPARIN (PORCINE) IN NACL 1000-0.9 UT/500ML-% IV SOLN
INTRAVENOUS | Status: DC | PRN
  Administered 2023-04-22 (×2): 500 mL

## 2023-04-22 MED ORDER — TICAGRELOR 90 MG PO TABS
ORAL_TABLET | ORAL | Status: AC
  Filled 2023-04-22: qty 1

## 2023-04-22 MED ORDER — NITROGLYCERIN 0.4 MG SL SUBL: 0.4000 mg | SUBLINGUAL_TABLET | SUBLINGUAL | Status: DC | PRN

## 2023-04-22 MED ORDER — HEPARIN (PORCINE) 25000 UT/250ML-% IV SOLN
1150.0000 [IU]/h | INTRAVENOUS | Status: DC
  Administered 2023-04-22: 1150 [IU]/h via INTRAVENOUS
  Filled 2023-04-22: qty 250

## 2023-04-22 MED ORDER — FENTANYL CITRATE (PF) 100 MCG/2ML IJ SOLN
INTRAMUSCULAR | Status: AC
  Filled 2023-04-22: qty 2

## 2023-04-22 MED ORDER — ADENOSINE (DIAGNOSTIC) FOR INTRACORONARY USE
INTRAVENOUS | Status: DC | PRN
  Administered 2023-04-22: 200 ug via INTRACORONARY

## 2023-04-22 MED ORDER — VERAPAMIL HCL 2.5 MG/ML IV SOLN
INTRAVENOUS | Status: AC
  Filled 2023-04-22: qty 2

## 2023-04-22 MED ORDER — ACETAMINOPHEN 325 MG PO TABS: 650.0000 mg | ORAL_TABLET | ORAL | Status: DC | PRN

## 2023-04-22 MED ORDER — MIDAZOLAM HCL 2 MG/2ML IJ SOLN
INTRAMUSCULAR | Status: AC
  Filled 2023-04-22: qty 2

## 2023-04-22 MED ORDER — ASPIRIN 81 MG PO TBEC: 81.0000 mg | DELAYED_RELEASE_TABLET | Freq: Every day | ORAL | Status: DC

## 2023-04-22 MED ORDER — ONDANSETRON HCL 4 MG/2ML IJ SOLN: 4.0000 mg | Freq: Four times a day (QID) | INTRAMUSCULAR | Status: DC | PRN

## 2023-04-22 MED ORDER — SODIUM CHLORIDE 0.9 % IV SOLN: 250.0000 mL | INTRAVENOUS | Status: DC | PRN

## 2023-04-22 MED ORDER — TRAZODONE HCL 50 MG PO TABS
50.0000 mg | ORAL_TABLET | Freq: Every day | ORAL | Status: DC
  Administered 2023-04-22: 50 mg via ORAL
  Filled 2023-04-22: qty 1

## 2023-04-22 MED ORDER — SODIUM CHLORIDE 0.9 % WEIGHT BASED INFUSION: 3.0000 mL/kg/h | INTRAVENOUS | Status: DC

## 2023-04-22 MED ORDER — HEPARIN SODIUM (PORCINE) 1000 UNIT/ML IJ SOLN
INTRAMUSCULAR | Status: DC | PRN
  Administered 2023-04-22: 5000 [IU] via INTRAVENOUS

## 2023-04-22 MED ORDER — ASPIRIN 81 MG PO CHEW
CHEWABLE_TABLET | ORAL | Status: AC
  Filled 2023-04-22: qty 1

## 2023-04-22 MED ORDER — ASPIRIN 81 MG PO CHEW: 81.0000 mg | CHEWABLE_TABLET | Freq: Every day | ORAL | Status: DC

## 2023-04-22 MED ORDER — SODIUM CHLORIDE 0.9% FLUSH: 3.0000 mL | INTRAVENOUS | Status: DC | PRN

## 2023-04-22 MED ORDER — LABETALOL HCL 5 MG/ML IV SOLN
10.0000 mg | INTRAVENOUS | Status: AC | PRN
  Administered 2023-04-22: 10 mg via INTRAVENOUS
  Filled 2023-04-22: qty 4

## 2023-04-22 MED ORDER — IOHEXOL 350 MG/ML SOLN
INTRAVENOUS | Status: DC | PRN
  Administered 2023-04-22: 90 mL

## 2023-04-22 MED ORDER — AMLODIPINE BESYLATE 5 MG PO TABS
5.0000 mg | ORAL_TABLET | Freq: Every day | ORAL | Status: DC
  Filled 2023-04-22: qty 1

## 2023-04-22 MED ORDER — HYDRALAZINE HCL 20 MG/ML IJ SOLN
10.0000 mg | INTRAMUSCULAR | Status: AC | PRN
  Administered 2023-04-22: 10 mg via INTRAVENOUS
  Filled 2023-04-22: qty 1

## 2023-04-22 MED ORDER — VERAPAMIL HCL 2.5 MG/ML IV SOLN
INTRAVENOUS | Status: DC | PRN
  Administered 2023-04-22: 10 mL via INTRA_ARTERIAL

## 2023-04-22 MED ORDER — NITROPRUSSIDE SODIUM-NACL 20-0.9 MG/100ML-% IV SOLN
INTRAVENOUS | Status: AC
  Filled 2023-04-22: qty 100

## 2023-04-22 MED ORDER — MIDAZOLAM HCL 2 MG/2ML IJ SOLN
INTRAMUSCULAR | Status: DC | PRN
  Administered 2023-04-22 (×2): 1 mg via INTRAVENOUS

## 2023-04-22 MED ORDER — HEPARIN BOLUS VIA INFUSION
4000.0000 [IU] | Freq: Once | INTRAVENOUS | Status: AC
  Administered 2023-04-22: 4000 [IU] via INTRAVENOUS
  Filled 2023-04-22: qty 4000

## 2023-04-22 MED ORDER — FENTANYL CITRATE (PF) 100 MCG/2ML IJ SOLN
INTRAMUSCULAR | Status: DC | PRN
  Administered 2023-04-22 (×2): 25 ug via INTRAVENOUS

## 2023-04-22 MED ORDER — NITROGLYCERIN IN D5W 200-5 MCG/ML-% IV SOLN
0.0000 ug/min | INTRAVENOUS | Status: DC
  Administered 2023-04-22: 5 ug/min via INTRAVENOUS
  Filled 2023-04-22: qty 250

## 2023-04-22 MED ORDER — CLOPIDOGREL BISULFATE 300 MG PO TABS
ORAL_TABLET | ORAL | Status: AC
  Filled 2023-04-22: qty 2

## 2023-04-22 MED ORDER — SODIUM CHLORIDE 0.9 % IV SOLN: INTRAVENOUS | Status: AC

## 2023-04-22 MED ORDER — ASPIRIN 81 MG PO CHEW: 324.0000 mg | CHEWABLE_TABLET | Freq: Once | ORAL | Status: DC

## 2023-04-22 MED ORDER — SODIUM CHLORIDE 0.9% FLUSH: 3.0000 mL | Freq: Two times a day (BID) | INTRAVENOUS | Status: DC

## 2023-04-22 MED ORDER — ADENOSINE 6 MG/2ML IV SOLN
INTRAVENOUS | Status: AC
  Filled 2023-04-22: qty 2

## 2023-04-22 MED ORDER — LIDOCAINE HCL (PF) 1 % IJ SOLN
INTRAMUSCULAR | Status: AC
  Filled 2023-04-22: qty 30

## 2023-04-22 MED ORDER — SODIUM CHLORIDE 0.9 % WEIGHT BASED INFUSION: 1.0000 mL/kg/h | INTRAVENOUS | Status: DC

## 2023-04-22 MED ORDER — LIDOCAINE HCL (PF) 1 % IJ SOLN
INTRAMUSCULAR | Status: DC | PRN
  Administered 2023-04-22: 5 mL via INTRADERMAL

## 2023-04-22 MED ORDER — ACETAMINOPHEN 325 MG PO TABS
650.0000 mg | ORAL_TABLET | ORAL | Status: DC | PRN
  Administered 2023-04-22 – 2023-04-23 (×3): 650 mg via ORAL
  Filled 2023-04-22 (×3): qty 2

## 2023-04-22 MED ORDER — ATORVASTATIN CALCIUM 80 MG PO TABS
80.0000 mg | ORAL_TABLET | Freq: Every day | ORAL | Status: DC
  Administered 2023-04-22: 80 mg via ORAL
  Filled 2023-04-22: qty 1

## 2023-04-22 SURGICAL SUPPLY — 10 items
CATH INFINITI 5 FR IM (CATHETERS) IMPLANT
CATH INFINITI 6F ANG MULTIPACK (CATHETERS) IMPLANT
DEVICE RAD COMP TR BAND LRG (VASCULAR PRODUCTS) IMPLANT
GLIDESHEATH SLEND SS 6F .021 (SHEATH) IMPLANT
KIT HEART LEFT (KITS) ×1 IMPLANT
PACK CARDIAC CATHETERIZATION (CUSTOM PROCEDURE TRAY) ×1 IMPLANT
SET ATX-X65L (MISCELLANEOUS) IMPLANT
TRANSDUCER W/STOPCOCK (MISCELLANEOUS) ×1 IMPLANT
TUBING CIL FLEX 10 FLL-RA (TUBING) ×1 IMPLANT
WIRE EMERALD 3MM-J .035X260CM (WIRE) IMPLANT

## 2023-04-22 NOTE — Progress Notes (Signed)
Echocardiogram 2D Echocardiogram has been performed.  Lucendia Herrlich 04/22/2023, 2:40 PM

## 2023-04-22 NOTE — ED Provider Notes (Signed)
Cameron Camacho EMERGENCY DEPARTMENT AT [facility] Provider Note   CSN: [id number] Arrival date & time: 04/22/23  0701     History  Chief Complaint  Patient presents with   Chest Pain    Cameron Camacho is a 67 y.o. male.  HPI Patient presents with chest tightness that began yesterday about 19 hours prior to ED arrival.  Patient was in his usual state of health prior to onset.  Since that time there is been chest tightness with associated dyspnea.  Pain is now better after aspirin, nitroglycerin x 3.  Patient's history is notable for CABG, he states that he has been taking his medication as directed.  He was well prior to yesterday.    Home Medications Prior to Admission medications   Medication Sig Start Date End Date Taking? Authorizing Provider  acetaminophen (TYLENOL) 325 MG tablet Take 2 tablets (650 mg total) by mouth every 6 (six) hours as needed for mild pain or headache. 01/12/18   Barrett, Erin R, PA-C  amLODipine (NORVASC) 5 MG tablet TAKE 1 TABLET BY MOUTH ONCE DAILY . 10/31/22   Quintella Reichert, MD  APPLE CIDER VINEGAR PO Take 15 mLs by mouth daily. Mix in 8 oz water and drink    [provider]  aspirin EC 81 MG tablet Take 1 tablet (81 mg total) by mouth daily. 02/04/18   Dyann Kief, PA-C  atorvastatin (LIPITOR) 80 MG tablet Take 1 tablet (80 mg total) by mouth daily at 6 PM. Patient not taking: Reported on 03/26/2018 01/12/18   Barrett, Rae Roam, PA-C  clopidogrel (PLAVIX) 75 MG tablet Take 1 tablet (75 mg total) by mouth daily. 10/31/22   Quintella Reichert, MD  metoprolol tartrate (LOPRESSOR) 25 MG tablet Take 1 tablet (25 mg total) by mouth 2 (two) times daily. 01/12/18   Barrett, Erin R, PA-C  Naphazoline HCl (CLEAR EYES OP) Place 1 drop into both eyes daily as needed (tired eyes).    [provider]  nitroGLYCERIN (NITROSTAT) 0.4 MG SL tablet DISSOLVE ONE TABLET UNDER THE TONGUE EVERY 5 MINUTES AS NEEDED FOR CHEST PAIN.  DO NOT EXCEED A TOTAL OF  3 DOSES IN 15 MINUTES 10/31/22   Turner, Cornelious Bryant, MD  oxymetazoline (AFRIN) 0.05 % nasal spray Place 1 spray into both nostrils 2 (two) times daily.    [provider]      Allergies    Lisinopril    Review of Systems   Review of Systems  All other systems reviewed and are negative.   Physical Exam Updated Vital Signs BP (!) 164/69 (BP Location: Right Arm)   Pulse (!) 54   Temp 98.7 F (37.1 C) (Oral)   Resp (!) 24   Ht 5\' 11"  (1.803 m)   Wt 88.5 kg   SpO2 98%   BMI 27.20 kg/m  Physical Exam Vitals and nursing note reviewed.  Constitutional:      General: He is not in acute distress.    Appearance: He is well-developed.  HENT:     Head: Normocephalic and atraumatic.  Eyes:     Conjunctiva/sclera: Conjunctivae normal.  Cardiovascular:     Rate and Rhythm: Normal rate and regular rhythm.  Pulmonary:     Effort: Pulmonary effort is normal. No respiratory distress.     Breath sounds: No stridor.  Abdominal:     General: There is no distension.  Skin:    General: Skin is warm and dry.  Neurological:  Mental Status: He is alert and oriented to person, place, and time.     ED Results / Procedures / Treatments   Labs (all labs ordered are listed, but only abnormal results are displayed) Labs Reviewed  CBG MONITORING, ED - Abnormal; Notable for the following components:      Result Value   Glucose-Capillary 156 (*)    All other components within normal limits  BASIC METABOLIC PANEL  CBC  BRAIN NATRIURETIC PEPTIDE  TROPONIN I (HIGH SENSITIVITY)    EKG EKG Interpretation Date/Time:  Tuesday April 22 2023 07:02:52 EDT Ventricular Rate:  60 PR Interval:  137 QRS Duration:  117 QT Interval:  472 QTC Calculation: 472 R Axis:   40  Text Interpretation: Sinus rhythm Nonspecific intraventricular conduction delay ST-t wave abnormality Abnormal ECG Confirmed by Gerhard Munch [phone]) on 04/22/2023 7:06:31 AM  Radiology DG Chest Portable 1  View  Result Date: 04/22/2023 CLINICAL DATA:  Chest pain EXAM: PORTABLE CHEST 1 VIEW COMPARISON:  03/26/2018 FINDINGS: Low volume film. Basilar atelectasis noted bilaterally without substantial pleural effusion. No pulmonary edema or dense focal airspace consolidation. Telemetry leads overlie the chest. IMPRESSION: Low volume film with bibasilar atelectasis. Electronically Signed   By: Kennith Center M.D.   On: 04/22/2023 07:28    Procedures Procedures    Medications Ordered in ED Medications  aspirin chewable tablet 324 mg (324 mg Oral Not Given 04/22/23 8119)    ED Course/ Medical Decision Making/ A&P                                 Medical Decision Making Adult male with history of CABG, hypertension, presents with chest tightness.  With history, elevated risk profile, ACS in immediate concern.  Patient's ECG is different from prior, and case discussed with cardiology soon after patient's arrival.  Patient started on heparin, initial labs notable for troponin 180.  Second troponin 281.  Amount and/or Complexity of Data Reviewed External Data Reviewed: notes.    Details: Prior notes including CABG reviewed Labs: ordered. Decision-making details documented in ED Course. Radiology: ordered and independent interpretation performed. Decision-making details documented in ED Course. ECG/medicine tests: ordered and independent interpretation performed. Decision-making details documented in ED Course. Discussion of management or test interpretation with external provider(s): As above Case discussed with cardiology soon after patient's arrival, with initiation of heparin.  Patient admitted for unstable angina  Risk OTC drugs. Prescription drug management. Decision regarding hospitalization.  Critical Care Total time providing critical care: 40 minutes  Final Clinical Impression(s) / ED Diagnoses Final diagnoses:  Unstable angina [facility])     Gerhard Munch, MD 04/22/23 1514

## 2023-04-22 NOTE — ED Notes (Signed)
ECHO at bedside.

## 2023-04-22 NOTE — Interval H&P Note (Signed)
History and Physical Interval Note:  04/22/2023 3:02 PM  Cameron Camacho  has presented today for surgery, with the diagnosis of chest pain.  The various methods of treatment have been discussed with the patient and family. After consideration of risks, benefits and other options for treatment, the patient has consented to  Procedure(s): LEFT HEART CATH AND CORS/GRAFTS ANGIOGRAPHY (N/A) as a surgical intervention.  The patient's history has been reviewed, patient examined, no change in status, stable for surgery.  I have reviewed the patient's chart and labs.  Questions were answered to the patient's satisfaction.    Cath Lab Visit (complete for each Cath Lab visit)  Clinical Evaluation Leading to the Procedure:   ACS: Yes.    Non-ACS:    Anginal Classification: CCS IV  Anti-ischemic medical therapy: Maximal Therapy (2 or more classes of medications)  Non-Invasive Test Results: No non-invasive testing performed  Prior CABG: Previous CABG        Cameron Camacho

## 2023-04-22 NOTE — Progress Notes (Signed)
ANTICOAGULATION CONSULT NOTE - Initial Consult  Pharmacy Consult for Heparin infusion Indication: chest pain/ACS  Allergies  Allergen Reactions   Lisinopril Hives and Swelling    Patient Measurements: Height: 5\' 11"  (180.3 cm) Weight: 88.5 kg (195 lb) IBW/kg (Calculated) : 75.3 Heparin Dosing Weight: 88.5 kg  Vital Signs: Temp: 98.7 F (37.1 C) (08/13 0706) Temp Source: Oral (08/13 0706) BP: 164/69 (08/13 0706) Pulse Rate: 54 (08/13 0706)  Labs: No results for input(s): "HGB", "HCT", "PLT", "APTT", "LABPROT", "INR", "HEPARINUNFRC", "HEPRLOWMOCWT", "CREATININE", "CKTOTAL", "CKMB", "TROPONINIHS" in the last 72 hours.  CrCl cannot be calculated (Patient's most recent lab result is older than the maximum 21 days allowed.).   Medical History: Past Medical History:  Diagnosis Date   Hyperlipidemia    Hypertension    Prediabetes     Medications:  (Not in a hospital admission)   Assessment: 67 yo M presents to ED with chest pain ~12 hours PTA. Patient does not take any anticoagulation at home. Pharmacy consulted to dose and manage heparin infusion as per ACS protocol.   CBC pending No s/sx of bleeding  Goal of Therapy:  Heparin level 0.3-0.7 units/ml Monitor platelets by anticoagulation protocol: Yes   Plan:  Give heparin 4000 units IV bolus, then Initiate heparin infusion at 1150 units/hr Check heparin level in 6 hours Monitor daily CBC, heparin levels and for s/sx of bleeding   Wilburn Cornelia, PharmD, BCPS Clinical Pharmacist 04/22/2023 7:53 AM   Please refer to AMION for pharmacy phone number

## 2023-04-22 NOTE — H&P (Addendum)
Cardiology Admission History and Physical   Patient ID: Cameron Camacho MRN: [id number]; DOB: 02/19/1956   Admission date: 04/22/2023  PCP:  Patient, No Pcp Per   [facility] Providers Cardiologist:  Armanda Magic, MD     Chief Complaint:  Chest pain  Patient Profile:   Cameron Camacho is a 67 y.o. male with CAD s/p 3v CABG (LIMA to LAD, SVG to OM, SVG to RCA), hypertension, hyperlipidemia who is being seen 04/22/2023 for the evaluation of chest pain.  History of Present Illness:   Cameron Camacho is a 67 year old male with past medical history noted above.  He has been followed by Dr. Mayford Knife as an outpatient.  He presented to [facility] 12/2017 with a non-STEMI and underwent cardiac catheterization found to have a critical distal left main and multivessel CAD.  Underwent three-vessel CABG with LIMA to LAD, SVG to OM and SVG to RCA.  Progressed well postoperatively.  It was recommended that he be on aspirin, Plavix, beta-blocker, ACE inhibitor and high intensity statin at discharge.  Echocardiogram with LVEF of 65 to 70%, no regional wall motion abnormality, grade 1 diastolic dysfunction.  He was seen back in the office on 01/2018 for follow-up.  He reported never being placed on Plavix.  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  At this visit he was started on Plavix 75 mg daily with recommendations for 1 year.  Presented to the ED on 8/13 with complaints of chest pain.  States he has been in his usual state of health up until yesterday afternoon.  He was working around his house and developed centralized chest pressure/pain.  Symptoms lingered through the afternoon and evening.  Ultimately called EMS this morning.  Labs showed sodium 135, potassium 3.5, creatinine 1.1, BNP 119, high-sensitivity troponin 182>> 281, WBC 6.5, hemoglobin 15.5.  EKG showed sinus rhythm, 60 bpm, prolonged QT, slight ST elevation in lead III, aVF with biphasic  T waves in lead I and aVL.  Chest x-ray with bibasilar atelectasis.   In talking with patient he has not been compliant with his medications.  States he weaned himself off of his statin and his amlodipine.  Has only been taking baby aspirin.  Reports he has been very active playing golf on a daily basis as well as sticking to a heart healthy diet.  States he does not believe he needs to be on medications, "doesn't have a cholesterol issue".    Past Medical History:  Diagnosis Date   Hyperlipidemia    Hypertension    Prediabetes     Past Surgical History:  Procedure Laterality Date   CORONARY ARTERY BYPASS GRAFT N/A 01/06/2018   Procedure: CORONARY ARTERY BYPASS GRAFTING (CABG) x three, using left internal mammary artery and right leg greater saphenous vein harvested endoscopically;  Surgeon: Delight Ovens, MD;  Location: [facility] OR;  Service: Open Heart Surgery;  Laterality: N/A;   LEFT HEART CATH AND CORONARY ANGIOGRAPHY N/A 01/05/2018   Procedure: LEFT HEART CATH AND CORONARY ANGIOGRAPHY;  Surgeon: Lennette Bihari, MD;  Location: [facility] INVASIVE CV LAB;  Service: Cardiovascular;  Laterality: N/A;   TEE WITHOUT CARDIOVERSION N/A 01/06/2018   Procedure: TRANSESOPHAGEAL ECHOCARDIOGRAM (TEE);  Surgeon: Delight Ovens, MD;  Location: [facility] OR;  Service: Open Heart Surgery;  Laterality: N/A;     Medications Prior to Admission: Prior to Admission medications   Medication Sig Start Date End Date Taking?  Authorizing Provider  acetaminophen (TYLENOL) 325 MG tablet Take 2 tablets (650 mg total) by mouth every 6 (six) hours as needed for mild pain or headache. 01/12/18   Barrett, Erin R, PA-C  amLODipine (NORVASC) 5 MG tablet TAKE 1 TABLET BY MOUTH ONCE DAILY . 10/31/22   Quintella Reichert, MD  APPLE CIDER VINEGAR PO Take 15 mLs by mouth daily. Mix in 8 oz water and drink    [provider]  aspirin EC 81 MG tablet Take 1 tablet (81 mg total) by mouth daily. 02/04/18   Dyann Kief, PA-C   atorvastatin (LIPITOR) 80 MG tablet Take 1 tablet (80 mg total) by mouth daily at 6 PM. Patient not taking: Reported on 03/26/2018 01/12/18   Barrett, Rae Roam, PA-C  clopidogrel (PLAVIX) 75 MG tablet Take 1 tablet (75 mg total) by mouth daily. 10/31/22   Quintella Reichert, MD  metoprolol tartrate (LOPRESSOR) 25 MG tablet Take 1 tablet (25 mg total) by mouth 2 (two) times daily. 01/12/18   Barrett, Erin R, PA-C  Naphazoline HCl (CLEAR EYES OP) Place 1 drop into both eyes daily as needed (tired eyes).    [provider]  nitroGLYCERIN (NITROSTAT) 0.4 MG SL tablet DISSOLVE ONE TABLET UNDER THE TONGUE EVERY 5 MINUTES AS NEEDED FOR CHEST PAIN.  DO NOT EXCEED A TOTAL OF 3 DOSES IN 15 MINUTES 10/31/22   Turner, Cornelious Bryant, MD  oxymetazoline (AFRIN) 0.05 % nasal spray Place 1 spray into both nostrils 2 (two) times daily.    [provider]     Allergies:    Allergies  Allergen Reactions   Lisinopril Hives and Swelling    Social History:   Social History   Socioeconomic History   Marital status: Married    Spouse name: Not on file   Number of children: Not on file   Years of education: Not on file   Highest education level: Not on file  Occupational History   Not on file  Tobacco Use   Smoking status: Passive Smoke Exposure - Never Smoker   Smokeless tobacco: Never   Tobacco comments:    Smokes cannabis for pain control  Vaping Use   Vaping status: Never Used  Substance and Sexual Activity   Alcohol use: Not Currently   Drug use: Yes    Types: Marijuana    Comment: Occasional THC for pain   Sexual activity: Not on file  Other Topics Concern   Not on file  Social History Narrative   Not on file   Social Determinants of Health   Financial Resource Strain: Not on file  Food Insecurity: Not on file  Transportation Needs: Not on file  Physical Activity: Not on file  Stress: Not on file  Social Connections: Not on file  Intimate Partner Violence: Not on file    Family  History:   The patient's family history includes CAD in his father; Hypertension in his brother and mother.    ROS:  Please see the history of present illness.  All other ROS reviewed and negative.     Physical Exam/Data:   Vitals:   04/22/23 1115 04/22/23 1130 04/22/23 1145 04/22/23 1200  BP: (!) 178/83 (!) 166/74 (!) 157/71 (!) 145/63  Pulse: (!) 48 (!) 50 (!) 56 (!) 37  Resp: 17 15 17 16   Temp:      TempSrc:      SpO2: 99% 97% 98% 94%  Weight:      Height:  No intake or output data in the 24 hours ending 04/22/23 1211    04/22/2023    7:05 AM 04/09/2019    8:43 AM 03/26/2018   10:31 AM  Last 3 Weights  Weight (lbs) 195 lb 195 lb 202 lb  Weight (kg) 88.451 kg 88.451 kg 91.627 kg     Body mass index is 27.2 kg/m.  General:  Well nourished, well developed, in no acute distress HEENT: normal Neck: no JVD Vascular: No carotid bruits; Distal pulses 2+ bilaterally   Cardiac:  normal S1, S2; RRR; no murmur  Lungs:  clear to auscultation bilaterally, no wheezing, rhonchi or rales  Abd: soft, nontender, no hepatomegaly  Ext: no edema Musculoskeletal:  No deformities, BUE and BLE strength normal and equal Skin: warm and dry  Neuro:  CNs 2-12 intact, no focal abnormalities noted Psych:  Normal affect    EKG:  The ECG that was done 8/13 was personally reviewed and demonstrates sinus rhythm, 60 bpm prolonged QT, slight ST elevation in lead III and aVF  Relevant CV Studies:  Echo: 12/2017  Study Conclusions   - Left ventricle: The cavity size was normal. Wall thickness was    increased in a pattern of mild LVH. Systolic function was    vigorous. The estimated ejection fraction was in the range of 65%    to 70%. Wall motion was normal; there were no regional wall    motion abnormalities. Doppler parameters are consistent with    abnormal left ventricular relaxation (grade 1 diastolic    dysfunction).  - Aortic valve: Trileaflet; mildly thickened, moderately calcified     leaflets (left cusp).  - Mitral valve: Mildly thickened leaflets . There was trivial    regurgitation.   Cath: 12/2017  Dist LM to Ost LAD lesion is 95% stenosed. Mid LM to Dist LM lesion is 90% stenosed. Ost Cx to Prox Cx lesion is 70% stenosed. Prox RCA lesion is 20% stenosed. Mid RCA lesion is 70% stenosed. Dist RCA lesion is 50% stenosed. Ost RPDA lesion is 60% stenosed. RPDA lesion is 55% stenosed.   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   LVEDP 13 mmHg.   RECOMMENDATION: CABG revascularization.  Intervenous nitroglycerin was started at the end of the procedure.  Surgical consultation will be obtained.  The patient will be started on heparin 8 hours post radial sheath removal.  Diagnostic Dominance: Right   Laboratory Data:  High Sensitivity Troponin:   Recent Labs  Lab 04/22/23 0735 04/22/23 0907  TROPONINIHS 182* 281*      Chemistry Recent Labs  Lab 04/22/23 0735  NA 135  K 3.5  CL 101  CO2 24  GLUCOSE 159*  BUN 7*  CREATININE 1.10  CALCIUM 8.8*  GFRNONAA >60  ANIONGAP 10    No results for input(s): "PROT", "ALBUMIN", "AST", "ALT", "ALKPHOS", "BILITOT" in the last 168 hours. Lipids No results for input(s): "CHOL", "TRIG", "HDL", "LABVLDL", "LDLCALC", "CHOLHDL" in the last 168 hours. Hematology Recent Labs  Lab 04/22/23 0735  WBC 6.5  RBC 4.67  HGB 15.5  HCT 44.1  MCV 94.4  MCH 33.2  MCHC 35.1  RDW 12.8  PLT 229   Thyroid No results for input(s): "TSH", "FREET4" in the last 168 hours. BNP Recent Labs  Lab 04/22/23  0735  BNP 119.7*    DDimer No results for input(s): "DDIMER" in the last 168 hours.   Radiology/Studies:  DG Chest Portable 1 View  Result Date: 04/22/2023 CLINICAL DATA:  Chest pain EXAM: PORTABLE CHEST 1 VIEW  COMPARISON:  03/26/2018 FINDINGS: Low volume film. Basilar atelectasis noted bilaterally without substantial pleural effusion. No pulmonary edema or dense focal airspace consolidation. Telemetry leads overlie the chest. IMPRESSION: Low volume film with bibasilar atelectasis. Electronically Signed   By: Kennith Center M.D.   On: 04/22/2023 07:28     Assessment and Plan:   JOVONNIE DESPIRITO is a 67 y.o. male with CAD s/p 3v CABG (LIMA to LAD, SVG to OM, SVG to RCA), hypertension, hyperlipidemia who is being seen 04/22/2023 for the evaluation of chest pain.  NSTEMI CAD s/p 3v CABG (LIMA-LAD, SVG-OM, SVG-RCA) -- Presented with chest pain that started yesterday afternoon and lingered throughout the evening and this morning.  High-sensitivity troponin 182>> 281.  EKG with slight ST elevation in lead III and aVF with biphasic T waves in lead I and aVL.  Has been started on IV heparin and nitroglycerin.  Chest pain has improved to 2/10.  He has been noncompliant with his medications prior to admission and not seen in the office in 5 years. -- Discussed the need for cardiac catheterization to define coronary anatomy, assess grafts.  After long discussion patient agreeable to proceed. -- Continue IV heparin, nitroglycerin, add aspirin, statin -- Check echocardiogram  Hypertension -- Blood pressures elevated in the ED. States he has not been taking his amlodipine on a regular basis -- Resume amlodipine 5 mg daily, will hold on the addition of beta-blocker with baseline bradycardia  Hyperlipidemia -- Check lipids -- Resume atorvastatin 80 mg daily   Risk Assessment/Risk Scores:   TIMI Risk Score for Unstable Angina or Non-ST Elevation MI:   The patient's TIMI risk score is 6, which indicates a 41% risk of all cause mortality, new or recurrent myocardial infarction or need for urgent revascularization in the next 14 days.{  Code Status: Full Code  Severity of Illness: The appropriate patient status  for this patient is INPATIENT. Inpatient status is judged to be reasonable and necessary in order to provide the required intensity of service to ensure the patient's safety. The patient's presenting symptoms, physical exam findings, and initial radiographic and laboratory data in the context of their chronic comorbidities is felt to place them at high risk for further clinical deterioration. Furthermore, it is not anticipated that the patient will be medically stable for discharge from the hospital within 2 midnights of admission.   * I certify that at the point of admission it is my clinical judgment that the patient will require inpatient hospital care spanning beyond 2 midnights from the point of admission due to high intensity of service, high risk for further deterioration and high frequency of surveillance required.*   For questions or updates, please contact [facility] Please consult [email or web address] for contact info under     Signed, Laverda Page, NP  04/22/2023 12:11 PM    Patient seen and examined. Agree with assessment and plan.  Cameron Camacho is a 67 year old gentleman who has history of significant CAD.  In May 2019 I performed cardiac catheterization which revealed high-grade distal left main ostial LAD circumflex and RCA disease.  He went CABG revascularization surgery by Dr. Tyrone Sage with a LIMA to LAD, SVG to OM, and SVG to RCA.  Postoperatively, he was  followed by Dr. Mayford Knife.  Apparently he has not seen her in many years.  He has reduced some of his medications and has only intermittently been taking Plavix and has been taking amlodipine.  He is retired and admits to playing golf avidly.  He had been without any chest pain until yesterday when he developed centralized chest pressure mild shortness of breath.  Due to persistent symptomatology, he ultimately called EMS.  Presently, he is pain-free on IV heparin and IV nitroglycerin.  Troponins are elevated at 182 and 281.   ECG reveals sinus rhythm at 60 bpm with T wave inversion in leads I and aVL V2 and V3 with small Q-wave in 3 and F.  Pressure presently was 140/60 with pulse in the mid 50s.  There is no JVD or carotid bruits.  Lungs were relatively clear.  There was no chest wall tenderness to palpation.  Abdomen was soft and nontender.  Pulses were 2+.  There was no significant edema.  Glucose is mildly elevated.  BUN 7 creatinine 1.1.  Calcium 8.8.  Potassium 3.5.  Hemoglobin 15.5 hematocrit 44.1.  Symptom complex is compatible with recurrent anginal and mild troponin elevation consistent with non-STEMI.  I have strongly recommended definitive repeat cardiac catheterization.  Patient has only been taking medications intermittently and had self discontinued prior meds.The risks and benefits of a cardiac catheterization including, but not limited to, death, stroke, MI, kidney damage and bleeding were discussed with the patient who indicates understanding and agrees to proceed.  Plan to schedule catheterization this afternoon as add-on.   Lennette Bihari, MD, [facility] 04/22/2023 12:52 PM

## 2023-04-22 NOTE — ED Notes (Signed)
ED Provider at bedside. 

## 2023-04-22 NOTE — ED Triage Notes (Signed)
Pt states he has had chest pain for 12 hours about 12 hours that feels like tightness and radiates across his chest. 5/10 pain at this time. EMS reports that EKGs have gotten worse since initial pt encounter

## 2023-04-22 NOTE — Progress Notes (Signed)
ANTICOAGULATION CONSULT NOTE   Pharmacy Consult for Heparin infusion Indication: chest pain/ACS  Allergies  Allergen Reactions   Lisinopril Hives and Swelling    Patient Measurements: Height: 5\' 11"  (180.3 cm) Weight: 88.5 kg (195 lb) IBW/kg (Calculated) : 75.3 Heparin Dosing Weight: 88.5 kg  Vital Signs: Temp: 98.2 F (36.8 C) (08/13 2009) Temp Source: Oral (08/13 2009) BP: 154/73 (08/13 2020) Pulse Rate: 64 (08/13 2020)  Labs: Recent Labs    04/22/23 0735 04/22/23 0907  HGB 15.5  --   HCT 44.1  --   PLT 229  --   CREATININE 1.10  --   TROPONINIHS 182* 281*    Estimated Creatinine Clearance: 71.3 mL/min (by C-G formula based on SCr of 1.1 mg/dL).   Assessment: 67 yo M presents to ED with chest pain ~12 hours PTA. Patient does not take any anticoagulation at home. Pharmacy consulted to dose and manage heparin infusion as per ACS protocol.   S/p cath which showed severe native vessel disease but PCI deferred and medical therapy to be pursued. To restart heparin 2 hour post TR band removal. TR band removed 2115.  Goal of Therapy:  Heparin level 0.3-0.7 units/ml Monitor platelets by anticoagulation protocol: Yes   Plan:  At 2315, start heparin infusion at 1150 units/hr Check heparin level 8 hours post restart F/u planned LOT for heparin  Christoper Fabian, PharmD, BCPS Please see amion for complete clinical pharmacist phone list 04/22/2023 9:16 PM

## 2023-04-23 ENCOUNTER — Encounter (HOSPITAL_COMMUNITY): Payer: Self-pay | Admitting: Internal Medicine

## 2023-04-23 MED ORDER — BENZOCAINE 10 % MT GEL
Freq: Four times a day (QID) | OROMUCOSAL | Status: DC | PRN
  Filled 2023-04-23: qty 9

## 2023-04-23 NOTE — Progress Notes (Signed)
Pt left AMA, signed AMA form placed in chart. Dr. Anne Fu notified via Amion.

## 2023-06-03 ENCOUNTER — Other Ambulatory Visit: Payer: Self-pay | Admitting: Cardiology

## 2023-06-04 NOTE — Telephone Encounter (Signed)
Pt has not been seen since 2020, but has an appt on 09/24/2023 with Dr. Mayford Knife. Would Dr. Mayford Knife like to refill these medication or does pt need to be seen sooner? Please address

## 2023-07-16 ENCOUNTER — Other Ambulatory Visit: Payer: Self-pay | Admitting: Cardiology

## 2023-09-02 ENCOUNTER — Other Ambulatory Visit: Payer: Self-pay | Admitting: Cardiology

## 2023-09-15 ENCOUNTER — Other Ambulatory Visit: Payer: Self-pay

## 2023-09-15 MED ORDER — CLOPIDOGREL BISULFATE 75 MG PO TABS: 75.0000 mg | ORAL_TABLET | Freq: Every day | ORAL | 0 refills | Status: DC

## 2023-09-24 ENCOUNTER — Ambulatory Visit: Payer: 59 | Attending: Cardiology | Admitting: Cardiology

## 2023-09-24 ENCOUNTER — Encounter: Payer: Self-pay | Admitting: Cardiology

## 2023-09-24 VITALS — BP 162/84 | HR 69 | Ht 71.0 in | Wt 215.6 lb

## 2023-09-24 DIAGNOSIS — E785 Hyperlipidemia, unspecified: Secondary | ICD-10-CM | POA: Diagnosis not present

## 2023-09-24 DIAGNOSIS — I10 Essential (primary) hypertension: Secondary | ICD-10-CM | POA: Diagnosis not present

## 2023-09-24 DIAGNOSIS — Z79899 Other long term (current) drug therapy: Secondary | ICD-10-CM | POA: Diagnosis not present

## 2023-09-24 DIAGNOSIS — Z91199 Patient's noncompliance with other medical treatment and regimen due to unspecified reason: Secondary | ICD-10-CM | POA: Insufficient documentation

## 2023-09-24 DIAGNOSIS — I2583 Coronary atherosclerosis due to lipid rich plaque: Secondary | ICD-10-CM

## 2023-09-24 DIAGNOSIS — I2510 Atherosclerotic heart disease of native coronary artery without angina pectoris: Secondary | ICD-10-CM | POA: Diagnosis not present

## 2023-09-24 MED ORDER — ATORVASTATIN CALCIUM 80 MG PO TABS: 80.0000 mg | ORAL_TABLET | Freq: Every day | ORAL | 3 refills | Status: AC

## 2023-09-24 MED ORDER — NITROGLYCERIN 0.4 MG SL SUBL: SUBLINGUAL_TABLET | SUBLINGUAL | 3 refills | Status: AC

## 2023-09-24 MED ORDER — CLOPIDOGREL BISULFATE 75 MG PO TABS: 75.0000 mg | ORAL_TABLET | Freq: Every day | ORAL | 0 refills | Status: DC

## 2023-09-24 MED ORDER — METOPROLOL SUCCINATE ER 50 MG PO TB24: 50.0000 mg | ORAL_TABLET | Freq: Every day | ORAL | 3 refills | Status: DC

## 2023-09-24 MED ORDER — AMLODIPINE BESYLATE 5 MG PO TABS: ORAL_TABLET | ORAL | 3 refills | Status: AC

## 2023-09-24 MED ORDER — ATORVASTATIN CALCIUM 80 MG PO TABS: 80.0000 mg | ORAL_TABLET | Freq: Every day | ORAL | 3 refills | Status: DC

## 2023-09-24 NOTE — Patient Instructions (Signed)
 Medication Instructions:  Please STOP taking Lopressor .  Please START Toprol  XL 50 mg daily.  *If you need a refill on your cardiac medications before your next appointment, please call your pharmacy*   Lab Work: Please call [facility] and make an appointment to complete fasting cholesterol labs in 6 weeks.   If you have labs (blood work) drawn today and your tests are completely normal, you will receive your results only by: MyChart Message (if you have MyChart) OR A paper copy in the mail If you have any lab test that is abnormal or we need to change your treatment, we will call you to review the results.   Testing/Procedures: None.   Follow-Up:  Your next appointment:   1 year(s)  Provider:   Gaylyn Keas, MD     Other Instructions Please make an appointment to be seen in our hypertension clinic in 1 week. You can also make an appointment for a Nurse Visit to have your blood pressure checked in 1 week.

## 2023-09-24 NOTE — Addendum Note (Signed)
 Addended by: Gaylyn Keas R on: 09/24/2023 01:58 PM   Modules accepted: Level of Service

## 2023-09-24 NOTE — Addendum Note (Signed)
 Addended by: Cherylyn Cos on: 09/24/2023 02:11 PM   Modules accepted: Orders

## 2023-09-24 NOTE — Progress Notes (Addendum)
 Cardiology Note    Date:  09/24/2023   ID:  Cameron Camacho, DOB November 01, 1955, MRN [id number]  PCP:  Patient, No Pcp Per  Cardiologist:  Gaylyn Keas, MD   Chief Complaint  Patient presents with   New Patient (Initial Visit)    CAD, hypertension, hyperlipidemia   Coronary Artery Disease   Hyperlipidemia   Hypertension   History of Present Illness:  Cameron Camacho is a 68 y.o. male who is being seen today to reestablish cardiac care given his history of CAD, hypertension and hyperlipidemia who was lost to follow-up in 2020.    He then represented with chest pain 04/22/2023 with mildly elevated troponin and slight ST elevation in lead III and aVF.  He had been noncompliant with his medications.  He underwent cardiac cath showing patent SVG to distal RCA with luminal irregularities with 99% RPLA and RPDA beyond the insertion of the graft and the distal RCA, patent LIMA to the LAD, occluded proximal left circumflex with patent SVG to OM 2, occluded proximal LAD, occluded mid RCA with residual disease of 99% in the PDA and 99% in the PLA.     He is here today for followup and is doing well.  Since I saw him last he stopped the Plavix  because he said that he had already been on it for 5 years.  He also has not been taking his metoprolol  or statin. He has had problems with medical compliance in the past. He denies any chest pain or pressure since his last cath in 04/2023.  He has some mild SO at times.  He denies any PND, orthopnea, LE edema, dizziness, palpitations or syncope. He is compliant with his meds and is tolerating meds with no SE.     Past Medical History:  Diagnosis Date   CAD (coronary artery disease), native coronary artery    s/p NSTEMI 2019 s/p CABG  LIMA to the LAD, SVG to OM, SVG to RCA , s/p NSTEMI 04/2023 patent SVG to distal RCA with luminal irregularities with 99% RPLA and RPDA beyond the insertion of the graft and the distal RCA, patent LIMA to the LAD, occluded proximal  left circumflex with patent SVG to OM 2, occluded proximal LAD, occluded mid RCA with residual disease of 99% in the PDA and 99% in the PLA   Hyperlipidemia    Hypertension    Prediabetes     Past Surgical History:  Procedure Laterality Date   CORONARY ARTERY BYPASS GRAFT N/A 01/06/2018   Procedure: CORONARY ARTERY BYPASS GRAFTING (CABG) x three, using left internal mammary artery and right leg greater saphenous vein harvested endoscopically;  Surgeon: Norita Beauvais, MD;  Location: [facility] OR;  Service: Open Heart Surgery;  Laterality: N/A;   LEFT HEART CATH AND CORONARY ANGIOGRAPHY N/A 01/05/2018   Procedure: LEFT HEART CATH AND CORONARY ANGIOGRAPHY;  Surgeon: Millicent Ally, MD;  Location: [facility] INVASIVE CV LAB;  Service: Cardiovascular;  Laterality: N/A;   LEFT HEART CATH AND CORS/GRAFTS ANGIOGRAPHY N/A 04/22/2023   Procedure: LEFT HEART CATH AND CORS/GRAFTS ANGIOGRAPHY;  Surgeon: Kyra Phy, MD;  Location: [facility] INVASIVE CV LAB;  Service: Cardiovascular;  Laterality: N/A;   TEE WITHOUT CARDIOVERSION N/A 01/06/2018   Procedure: TRANSESOPHAGEAL ECHOCARDIOGRAM (TEE);  Surgeon: Norita Beauvais, MD;  Location: [facility] OR;  Service: Open Heart Surgery;  Laterality: N/A;    Current Medications: Current Meds  Medication Sig   acetaminophen  (TYLENOL ) 325 MG tablet Take 2 tablets (650  mg total) by mouth every 6 (six) hours as needed for mild pain or headache.   amLODipine  (NORVASC ) 5 MG tablet TAKE 1 TABLET BY MOUTH ONCE DAILY . APPOINTMENT REQUIRED FOR FUTURE REFILLS   APPLE CIDER VINEGAR PO Take 15 mLs by mouth daily. Mix in 8 oz water and drink   Naphazoline HCl (CLEAR EYES OP) Place 1 drop into both eyes daily as needed (tired eyes).   nitroGLYCERIN  (NITROSTAT ) 0.4 MG SL tablet DISSOLVE ONE TABLET UNDER THE TONGUE EVERY 5 MINUTES AS NEEDED FOR CHEST PAIN.  DO NOT EXCEED A TOTAL OF 3 DOSES IN 15 MINUTES    Allergies:   Lisinopril    Social History   Socioeconomic History   Marital status:  Married    Spouse name: Not on file   Number of children: Not on file   Years of education: Not on file   Highest education level: Not on file  Occupational History   Not on file  Tobacco Use   Smoking status: Never    Passive exposure: Yes   Smokeless tobacco: Never   Tobacco comments:    Smokes cannabis for pain control  Vaping Use   Vaping status: Never Used  Substance and Sexual Activity   Alcohol use: Not Currently   Drug use: Yes    Types: Marijuana    Comment: Occasional THC for pain   Sexual activity: Not on file  Other Topics Concern   Not on file  Social History Narrative   Not on file   Social Drivers of Health   Financial Resource Strain: Not on file  Food Insecurity: Not on file  Transportation Needs: Not on file  Physical Activity: Not on file  Stress: Not on file  Social Connections: Not on file     Family History:  The patient's family history includes CAD in his father; Hypertension in his brother and mother.   ROS:   Please see the history of present illness.    ROS All other systems reviewed and are negative.      No data to display             PHYSICAL EXAM:   VS:  BP (!) 162/84   Pulse 69   Ht 5\' 11"  (1.803 m)   Wt 215 lb 9.6 oz (97.8 kg)   SpO2 97%   BMI 30.07 kg/m    GEN: Well nourished, well developed, in no acute distress  HEENT: normal  Neck: no JVD, carotid bruits, or masses Cardiac: RRR; no murmurs, rubs, or gallops,no edema.  Intact distal pulses bilaterally.  Respiratory:  clear to auscultation bilaterally, normal work of breathing GI: soft, nontender, nondistended, + BS MS: no deformity or atrophy  Skin: warm and dry, no rash Neuro:  Alert and Oriented x 3, Strength and sensation are intact Psych: euthymic mood, full affect  Wt Readings from Last 3 Encounters:  09/24/23 215 lb 9.6 oz (97.8 kg)  04/22/23 195 lb (88.5 kg)  04/09/19 195 lb (88.5 kg)      Studies/Labs Reviewed:       Cardiac Studies &  Procedures   CARDIAC CATHETERIZATION  CARDIAC CATHETERIZATION 04/22/2023  Narrative   RPAV lesion is 99% stenosed.   RPDA lesion is 99% stenosed.   Mid RCA lesion is 100% stenosed.   Dist LM lesion is 80% stenosed.   Prox LAD lesion is 100% stenosed.   Prox Cx lesion is 100% stenosed.  1.  Patent LIMA to LAD and patent  vein graft to obtuse marginal. 2.  Patent vein graft to PDA; there is high-grade anastomotic lesions however these are not very small vessels and associated with slow flow.  Adenosine  was administered which improved flow.  The results were reviewed with Dr. Loetta Ringer the team C attending; given the small caliber of these vessels medical therapy will be pursued and PCI will be deferred. 3.  Severe native vessel disease. 4.  LVEDP of 7 mmHg.  Recommendation: Medical therapy.  Findings Coronary Findings Diagnostic  Dominance: Right  Left Main Dist LM lesion is 80% stenosed.  Left Anterior Descending Prox LAD lesion is 100% stenosed.  Left Circumflex Prox Cx lesion is 100% stenosed.  Right Coronary Artery Mid RCA lesion is 100% stenosed.  Right Posterior Descending Artery RPDA lesion is 99% stenosed.  Right Posterior Atrioventricular Artery RPAV lesion is 99% stenosed.  Graft To Dist RCA  LIMA Graft To Mid LAD  Graft To 2nd Mrg  Intervention  No interventions have been documented.   CARDIAC CATHETERIZATION  CARDIAC CATHETERIZATION 01/05/2018  Narrative  Dist LM to Ost LAD lesion is 95% stenosed.  Mid LM to Dist LM lesion is 90% stenosed.  Ost Cx to Prox Cx lesion is 70% stenosed.  Prox RCA lesion is 20% stenosed.  Mid RCA lesion is 70% stenosed.  Dist RCA lesion is 50% stenosed.  Ost RPDA lesion is 60% stenosed.  RPDA lesion is 55% stenosed.  Severe multivessel CAD with 80% distal left main stenosis; 95% ostial LAD stenosis; 70% proximal LAD stenosis and a superior takeoff dominant RCA with 20% proximal, diffuse 60 to 70% mid, 50%  distal, and 60% stenoses and a small caliber PDA vessel.  LVEDP 13 mmHg.  RECOMMENDATION: CABG revascularization.  Intervenous nitroglycerin  was started at the end of the procedure.  Surgical consultation will be obtained.  The patient will be started on heparin  8 hours post radial sheath removal.  Findings Coronary Findings Diagnostic  Dominance: Right  Left Main Mid LM to Dist LM lesion is 90% stenosed. Dist LM to Ost LAD lesion is 95% stenosed.  Left Circumflex Ost Cx to Prox Cx lesion is 70% stenosed.  Right Coronary Artery Prox RCA lesion is 20% stenosed. Mid RCA lesion is 70% stenosed. Dist RCA lesion is 50% stenosed.  Right Posterior Descending Artery Ost RPDA lesion is 60% stenosed. RPDA lesion is 55% stenosed.  Intervention  No interventions have been documented.    ECHOCARDIOGRAM  ECHOCARDIOGRAM COMPLETE 04/22/2023  Narrative ECHOCARDIOGRAM REPORT    Patient Name:   Cameron Camacho Date of Exam: 04/22/2023 Medical Rec #:  [id number]       Height:       71.0 in Accession #:    [id number]      Weight:       195.0 lb Date of Birth:  [birth year]        BSA:          2.086 m Patient Age:    67 years        BP:           149/132 mmHg Patient Gender: M               HR:           54 bpm. Exam Location:  Inpatient  Procedure: 2D Echo, Cardiac Doppler and Color Doppler  Indications:    Chest Pain R07.9  History:        Patient has prior history of Echocardiogram examinations, most  recent 01/05/2018. Previous Myocardial Infarction, Prior CABG; Risk Factors:Hypertension and Dyslipidemia.  Sonographer:    Terrilee Few Referring Phys: [phone] LINDSAY B ROBERTS  IMPRESSIONS   1. Left ventricular ejection fraction, by estimation, is 65 to 70%. The left ventricle has normal function. The left ventricle has no regional wall motion abnormalities. There is mild concentric left ventricular hypertrophy. Left ventricular diastolic parameters are consistent with Grade I  diastolic dysfunction (impaired relaxation). 2. Right ventricular systolic function is normal. The right ventricular size is mildly enlarged. 3. The mitral valve is normal in structure. No evidence of mitral valve regurgitation. No evidence of mitral stenosis. 4. The aortic valve is tricuspid. Aortic valve regurgitation is not visualized. No aortic stenosis is present. 5. The inferior vena cava is normal in size with greater than 50% respiratory variability, suggesting right atrial pressure of 3 mmHg.  Comparison(s): Unable to view 2019 study.  FINDINGS Left Ventricle: Left ventricular ejection fraction, by estimation, is 65 to 70%. The left ventricle has normal function. The left ventricle has no regional wall motion abnormalities. The left ventricular internal cavity size was normal in size. There is mild concentric left ventricular hypertrophy. Left ventricular diastolic parameters are consistent with Grade I diastolic dysfunction (impaired relaxation).  Right Ventricle: The right ventricular size is mildly enlarged. No increase in right ventricular wall thickness. Right ventricular systolic function is normal.  Left Atrium: Left atrial size was normal in size.  Right Atrium: Right atrial size was normal in size.  Pericardium: There is no evidence of pericardial effusion.  Mitral Valve: The mitral valve is normal in structure. No evidence of mitral valve regurgitation. No evidence of mitral valve stenosis.  Tricuspid Valve: The tricuspid valve is normal in structure. Tricuspid valve regurgitation is not demonstrated. No evidence of tricuspid stenosis.  Aortic Valve: The aortic valve is tricuspid. Aortic valve regurgitation is not visualized. No aortic stenosis is present. Aortic valve peak gradient measures 7.5 mmHg.  Pulmonic Valve: The pulmonic valve was normal in structure. Pulmonic valve regurgitation is trivial. No evidence of pulmonic stenosis.  Aorta: The aortic root and  ascending aorta are structurally normal, with no evidence of dilitation.  Venous: The inferior vena cava is normal in size with greater than 50% respiratory variability, suggesting right atrial pressure of 3 mmHg.  IAS/Shunts: No atrial level shunt detected by color flow Doppler.   LEFT VENTRICLE PLAX 2D LVIDd:         4.40 cm   Diastology LVIDs:         3.25 cm   LV e' medial:    6.22 cm/s LV PW:         1.00 cm   LV E/e' medial:  9.3 LV IVS:        1.10 cm   LV e' lateral:   9.32 cm/s LVOT diam:     2.30 cm   LV E/e' lateral: 6.2 LV SV:         87 LV SV Index:   42 LVOT Area:     4.15 cm   RIGHT VENTRICLE             IVC RV S prime:     12.30 cm/s  IVC diam: 1.30 cm TAPSE (M-mode): 1.7 cm  LEFT ATRIUM             Index        RIGHT ATRIUM           Index LA diam:  3.70 cm 1.77 cm/m   RA Area:     16.20 cm LA Vol (A2C):   36.2 ml 17.35 ml/m  RA Volume:   39.50 ml  18.94 ml/m LA Vol (A4C):   37.5 ml 17.98 ml/m LA Biplane Vol: 40.4 ml 19.37 ml/m AORTIC VALVE AV Area (Vmax): 2.94 cm AV Vmax:        137.00 cm/s AV Peak Grad:   7.5 mmHg LVOT Vmax:      97.00 cm/s LVOT Vmean:     61.633 cm/s LVOT VTI:       0.209 m  AORTA Ao Root diam: 3.20 cm Ao Asc diam:  2.90 cm  MITRAL VALVE               TRICUSPID VALVE MV Area (PHT): 3.21 cm    TR Peak grad:   6.1 mmHg MV Decel Time: 236 msec    TR Vmax:        123.00 cm/s MR Peak grad: 12.5 mmHg MR Vmax:      177.00 cm/s  SHUNTS MV E velocity: 57.70 cm/s  Systemic VTI:  0.21 m MV A velocity: 86.20 cm/s  Systemic Diam: 2.30 cm MV E/A ratio:  0.67  Gloriann Larger MD Electronically signed by Gloriann Larger MD Signature Date/Time: 04/22/2023/5:01:24 PM    Final  TEE  ECHO TEE 01/06/2018  Interpretation Summary  Aortic valve: The valve is trileaflet. Mild valve thickening present. Mild valve calcification present. No stenosis. No regurgitation.  Mitral valve: Mild leaflet thickening is present.   Right ventricle: Normal cavity size, wall thickness and ejection fraction.            Recent Labs: 04/22/2023: B Natriuretic Peptide 119.7; BUN 7; Creatinine, Ser 1.10; Hemoglobin 15.5; Platelets 229; Potassium 3.5; Sodium 135   Lipid Panel    Component Value Date/Time   CHOL 169 01/04/2018 0513   TRIG 51 01/04/2018 0513   HDL 56 01/04/2018 0513   CHOLHDL 3.0 01/04/2018 0513   VLDL 10 01/04/2018 0513   LDLCALC 103 (H) 01/04/2018 0513    ASSESSMENT:    1. Coronary artery disease due to lipid rich plaque   2. Dyslipidemia, goal LDL below 70   3. Essential hypertension   4. Medically noncompliant      PLAN:  In order of problems listed above:   ASCAD -Status post remote NSTEMI 2019 with cath showing critical distal left main and multivessel CAD -Status post remote CABG 01/06/2018 with LIMA to the LAD, SVG to OM, SVG to RCA  -Status post NSTEMI 04/2023 with cath showing patent SVG to distal RCA with luminal irregularities with 99% RPLA and RPDA beyond the insertion of the graft and the distal RCA, patent LIMA to the LAD, occluded proximal left circumflex with patent SVG to OM 2, occluded proximal LAD, occluded mid RCA with residual disease of 99% in the PDA and 99% in the PLA and medical management was recommended -He denies any anginal symptoms -He has been noncompliant with medical therapy in the past and continues to be  -I have stressed the importance of restarting  Plavix  75 mg daily, his BB and statin  Hyperlipidemia -LDL goal less than 70 -restart Atorvastatin  80mg  daily >>was not taking -repeat FLP and ALT in 6 weeks  Hypertension -BP elevated today>>he has not been compliant with is metoprolol  and amlodipine  (goal < 130/93mmHg) -He was supposed to be on Metoprolol  25mg  BID but was noncompliant and has not been taking.  I will change to  Toprol  XL 50mg  daily -Restart Amlodipine  5mg  daily -BP check with nurse or PharmD in 1 week  Time Spent: 20 minutes total time of  encounter, including 15 minutes spent in face-to-face patient care on the date of this encounter. This time includes coordination of care and counseling regarding above mentioned problem list. Remainder of non-face-to-face time involved reviewing chart documents/testing relevant to the patient encounter and documentation in the medical record. I have independently reviewed documentation from referring provider  Followup:  1  Medication Adjustments/Labs and Tests Ordered: Current medicines are reviewed at length with the patient today.  Concerns regarding medicines are outlined above.  Medication changes, Labs and Tests ordered today are listed in the Patient Instructions below.  There are no Patient Instructions on file for this visit.   Signed, Gaylyn Keas, MD  09/24/2023 1:53 PM    [facility] [address] [place], [place], [place]  [address] Phone: [phone]; Fax: [phone]

## 2023-10-02 ENCOUNTER — Telehealth: Payer: Self-pay | Admitting: Cardiology

## 2023-10-02 NOTE — Telephone Encounter (Signed)
Patient needs to r/s his nurse visit. Please advise

## 2023-10-02 NOTE — Telephone Encounter (Signed)
Called patient back, no answer. Left detailed message per DPR advising that Dr. Mayford Knife feels his BP for the most part looks good. Dr. Mayford Knife recommends he continue current medications and advises he make sure he is compliant with them. Advised patient to call our office if develops symptoms or if his blood pressure readings trend up or down as he is tracking them at home. Asked patient to give our office a call if he has any questions.

## 2023-10-02 NOTE — Telephone Encounter (Signed)
Returned pt called to report he has been following his BPs and wasn't sure if he needed to come in anymore.  (States he knows a little something as he was a Charity fundraiser in [employer]). Recent BP numbers: 1/19: 142/64 1/20: 136/58 1/21: 141/61 1/22: 141/66 1/23: 122/62 Aware I will forward to Dr. Norris Cross nurse to follow up with patient on whether nurse visit BP check needs to be rescheduled. Patient verbalized understanding and agreeable to plan.

## 2023-10-03 ENCOUNTER — Ambulatory Visit: Payer: 59

## 2023-10-11 ENCOUNTER — Other Ambulatory Visit: Payer: Self-pay | Admitting: Cardiology

## 2023-10-14 ENCOUNTER — Other Ambulatory Visit: Payer: Self-pay | Admitting: Cardiology

## 2023-10-14 MED ORDER — CLOPIDOGREL BISULFATE 75 MG PO TABS: 75.0000 mg | ORAL_TABLET | Freq: Every day | ORAL | 3 refills | Status: AC

## 2023-10-31 DIAGNOSIS — Z79899 Other long term (current) drug therapy: Secondary | ICD-10-CM | POA: Diagnosis not present

## 2023-10-31 DIAGNOSIS — E785 Hyperlipidemia, unspecified: Secondary | ICD-10-CM | POA: Diagnosis not present

## 2023-11-01 LAB — LIPID PANEL
Chol/HDL Ratio: 2 {ratio} (ref 0.0–5.0)
Cholesterol, Total: 144 mg/dL (ref 100–199)
HDL: 71 mg/dL (ref >39)
LDL Chol Calc (NIH): 61 mg/dL (ref 0–99)
Triglycerides: 59 mg/dL (ref 0–149)
VLDL Cholesterol Cal: 12 mg/dL (ref 5–40)

## 2023-11-01 LAB — ALT: ALT: 25 [IU]/L (ref 0–44)

## 2024-08-27 ENCOUNTER — Ambulatory Visit

## 2024-09-21 ENCOUNTER — Other Ambulatory Visit: Payer: Self-pay | Admitting: Cardiology

## 2024-11-11 ENCOUNTER — Ambulatory Visit: Admitting: Cardiology

## 2024-11-15 ENCOUNTER — Ambulatory Visit: Admitting: Cardiology
